# Patient Record
Sex: Male | Born: 1995 | Race: White | Hispanic: No | Marital: Married | Smoking: Never smoker
Health system: Southern US, Community
[De-identification: ages and names within clinical notes are randomized; demographics above are authoritative.]

## PROBLEM LIST (undated history)

## (undated) HISTORY — PX: HYDROCELE EXCISION / REPAIR: SUR1145

---
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
## 2013-03-19 ENCOUNTER — Emergency Department: Payer: Self-pay | Admitting: Internal Medicine

## 2013-03-19 LAB — COMPREHENSIVE METABOLIC PANEL
ANION GAP: 7 (ref 7–16)
Albumin: 4.2 g/dL (ref 3.4–5.0)
Alkaline Phosphatase: 125 U/L — ABNORMAL HIGH
BUN: 12 mg/dL (ref 7–18)
Bilirubin,Total: 0.6 mg/dL (ref 0.2–1.0)
CO2: 25 mmol/L (ref 21–32)
CREATININE: 1.07 mg/dL (ref 0.60–1.30)
Calcium, Total: 9 mg/dL (ref 8.5–10.1)
Chloride: 107 mmol/L (ref 98–107)
EGFR (African American): >60
GLUCOSE: 110 mg/dL — AB (ref 65–99)
Osmolality: 278 (ref 275–301)
POTASSIUM: 3.8 mmol/L (ref 3.5–5.1)
SGOT(AST): 11 U/L — ABNORMAL LOW (ref 15–37)
SGPT (ALT): 27 U/L (ref 12–78)
SODIUM: 139 mmol/L (ref 136–145)
Total Protein: 7.6 g/dL (ref 6.4–8.2)

## 2013-03-19 LAB — CBC WITH DIFFERENTIAL/PLATELET
BASOS ABS: 0 10*3/uL (ref 0.0–0.1)
BASOS PCT: 0.7 %
Eosinophil #: 0.2 10*3/uL (ref 0.0–0.7)
Eosinophil %: 2.8 %
HCT: 43.3 % (ref 40.0–52.0)
HGB: 15.2 g/dL (ref 13.0–18.0)
Lymphocyte #: 2.1 10*3/uL (ref 1.0–3.6)
Lymphocyte %: 36.6 %
MCH: 32 pg (ref 26.0–34.0)
MCHC: 35.1 g/dL (ref 32.0–36.0)
MCV: 91 fL (ref 80–100)
MONO ABS: 0.4 x10 3/mm (ref 0.2–1.0)
Monocyte %: 6.2 %
NEUTROS ABS: 3.1 10*3/uL (ref 1.4–6.5)
NEUTROS PCT: 53.7 %
PLATELETS: 279 10*3/uL (ref 150–440)
RBC: 4.75 10*6/uL (ref 4.40–5.90)
RDW: 12.7 % (ref 11.5–14.5)
WBC: 5.7 10*3/uL (ref 3.8–10.6)

## 2013-03-19 LAB — URINALYSIS, COMPLETE
Bacteria: NEGATIVE
Bilirubin,UR: NEGATIVE
GLUCOSE, UR: NEGATIVE mg/dL (ref 0–75)
Ketone: NEGATIVE
Leukocyte Esterase: NEGATIVE
Nitrite: NEGATIVE
PH: 5 (ref 4.5–8.0)
Protein: NEGATIVE
RBC,UR: >30 /HPF (ref 0–5)
Specific Gravity: 1.023 (ref 1.003–1.030)
WBC UR: NONE SEEN /HPF (ref 0–5)

## 2013-05-12 ENCOUNTER — Ambulatory Visit: Payer: Self-pay | Admitting: Family Medicine

## 2014-09-11 ENCOUNTER — Ambulatory Visit (INDEPENDENT_AMBULATORY_CARE_PROVIDER_SITE_OTHER): Payer: BLUE CROSS/BLUE SHIELD | Admitting: Family Medicine

## 2014-09-11 ENCOUNTER — Encounter: Payer: Self-pay | Admitting: Family Medicine

## 2014-09-11 VITALS — BP 124/86 | HR 80 | Temp 98.1°F | Resp 16 | Ht 74.5 in | Wt 209.0 lb

## 2014-09-11 DIAGNOSIS — R21 Rash and other nonspecific skin eruption: Secondary | ICD-10-CM

## 2014-09-11 DIAGNOSIS — L309 Dermatitis, unspecified: Secondary | ICD-10-CM | POA: Diagnosis not present

## 2014-09-11 DIAGNOSIS — Z87442 Personal history of urinary calculi: Secondary | ICD-10-CM

## 2014-09-11 DIAGNOSIS — M542 Cervicalgia: Secondary | ICD-10-CM

## 2014-09-11 DIAGNOSIS — R221 Localized swelling, mass and lump, neck: Secondary | ICD-10-CM | POA: Diagnosis not present

## 2014-09-11 MED ORDER — TRIAMCINOLONE ACETONIDE 0.1 % EX CREA: 1.0000 "application " | TOPICAL_CREAM | Freq: Two times a day (BID) | CUTANEOUS | Status: DC

## 2014-09-11 NOTE — Progress Notes (Signed)
Subjective:    Patient ID: Mike Torres, male    DOB: 03-08-1995, 19 y.o.   MRN: [id number]  Neck Pain  This is a new problem. The current episode started in the past 7 days. The problem occurs constantly. The problem has been gradually improving. The pain is associated with an unknown factor. The pain is present in the left side. Quality: Stiffness/Tightness. The pain is at a severity of 4/10. The symptoms are aggravated by twisting. The pain is same all the time. Stiffness is present all day. Pertinent negatives include no fever, headaches, numbness, trouble swallowing or weakness. He has tried NSAIDs for the symptoms. The treatment provided mild relief.  Rash This is a recurrent problem. The problem is unchanged. The affected locations include the right arm and left arm (Also behind both knees). The rash is characterized by blistering and itchiness. Associated with: Generally only happens when he is exposed to the sun. Associated symptoms include congestion and rhinorrhea. Pertinent negatives include no fatigue, fever, joint pain or sore throat. Past treatments include antibiotic cream, anti-itch cream and topical steroids. The treatment provided no relief. His past medical history is significant for allergies.   Patient Active Problem List   Diagnosis Date Noted  . History of kidney stones 09/11/2014   Family History  Problem Relation Age of Onset  . Healthy Mother   . Hypertension Father   . Healthy Brother   . Diabetes Maternal Grandmother   . Melanoma Maternal Grandmother   . Breast cancer Maternal Grandmother   . Colon cancer Maternal Grandfather   . Healthy Brother    History   Social History  . Marital Status: Single    Spouse Name: N/A  . Number of Children: N/A  . Years of Education: H/S   Occupational History  .  [employer]    Full - Time   Social History Main Topics  . Smoking status: Never Smoker   . Smokeless tobacco: Never Used  . Alcohol Use: No  . Drug Use: No   . Sexual Activity: Not on file   Other Topics Concern  . Not on file   Social History Narrative   Past Surgical History  Procedure Laterality Date  . Hydrocele excision / repair    . Tympanostomy tube placement  1996   No Known Allergies Previous Medications   No medications on file       Review of Systems  Constitutional: Negative for fever, chills, diaphoresis, activity change, appetite change, fatigue and unexpected weight change.  HENT: Positive for congestion, postnasal drip and rhinorrhea. Negative for ear discharge, ear pain, facial swelling, hearing loss, mouth sores, nosebleeds, sinus pressure, sneezing, sore throat, tinnitus, trouble swallowing and voice change.        History of Allergies.  Musculoskeletal: Positive for neck pain and neck stiffness. Negative for myalgias, back pain, joint pain, joint swelling, arthralgias and gait problem.  Skin: Positive for rash. Negative for color change, pallor and wound.  Allergic/Immunologic: Positive for environmental allergies.  Neurological: Negative for dizziness, syncope, weakness, light-headedness, numbness and headaches.       Objective:   Physical Exam  Constitutional: He is oriented to person, place, and time. He appears well-developed and well-nourished.  HENT:  Head: Normocephalic and atraumatic.  Right Ear: Tympanic membrane and external ear normal.  Left Ear: Tympanic membrane and external ear normal.  Nose: Mucosal edema present. No rhinorrhea. Right sinus exhibits no maxillary sinus tenderness. Left sinus exhibits no maxillary sinus tenderness.  Mouth/Throat: Uvula is midline and oropharynx is clear and moist.  Eyes: Conjunctivae and EOM are normal. Pupils are equal, round, and reactive to light.  Neck: Trachea normal and normal range of motion. Neck supple. Normal carotid pulses and no hepatojugular reflux present. Muscular tenderness: with tenderness and fullness over left sternocleidomastoid.  Carotid  bruit is not present. Normal range of motion present. No Brudzinski's sign and no Kernig's sign noted. Thyromegaly (Maybe mild) present.  Cardiovascular: Normal rate and regular rhythm.   Pulmonary/Chest: Effort normal and breath sounds normal. He has no wheezes. He has no rales.  Neurological: He is alert and oriented to person, place, and time.  Skin: Skin is warm and dry. Rash (does have scaly, dry rash in antecubital fossa. ) noted.    BP 124/86 mmHg  Pulse 80  Temp(Src) 98.1 F (36.7 C) (Oral)  Resp 16  Ht 6' 2.5" (1.892 m)  Wt 209 lb (94.802 kg)  BMI 26.48 kg/m2       Assessment & Plan:  1. Neck pain Suspect muscle strain.  Etiology unclear. Does lift a work. Will monitor. Is improving.   2. Eczema Condition is worsening. Will start medication for better control.  Call back if condition worsens or does not continue to improve.    - triamcinolone cream (KENALOG) 0.1 %; Apply 1 application topically 2 (two) times daily.  Dispense: 45 g; Refill: 3  3. Neck swelling Does have mildly enlarged thyroid and fullness in left neck.  Will schedule ultrasound and further plan pending these results. - US Soft Tissue Head/Neck; Future  Lorie Phenix, MD

## 2014-09-14 ENCOUNTER — Ambulatory Visit
Admission: RE | Admit: 2014-09-14 | Discharge: 2014-09-14 | Disposition: A | Discharge status: Home / Self Care | Payer: BLUE CROSS/BLUE SHIELD | Source: Ambulatory Visit | Attending: Family Medicine | Admitting: Family Medicine

## 2014-09-14 DIAGNOSIS — R221 Localized swelling, mass and lump, neck: Secondary | ICD-10-CM | POA: Insufficient documentation

## 2014-09-14 DIAGNOSIS — E049 Nontoxic goiter, unspecified: Secondary | ICD-10-CM | POA: Diagnosis present

## 2014-09-17 ENCOUNTER — Telehealth: Payer: Self-pay

## 2014-09-17 NOTE — Telephone Encounter (Signed)
Pt advised; he reports the swelling is much better.   Thanks,   -Vernona Rieger

## 2014-09-17 NOTE — Telephone Encounter (Signed)
-----   Message from Lorie Phenix, MD sent at 09/16/2014  2:21 PM EDT ----- Thyroid normal. Please notify patient and see if swelling has continued to go down. Thanks.

## 2015-03-08 ENCOUNTER — Encounter: Payer: Self-pay | Admitting: Family Medicine

## 2015-03-08 ENCOUNTER — Ambulatory Visit (INDEPENDENT_AMBULATORY_CARE_PROVIDER_SITE_OTHER): Payer: BLUE CROSS/BLUE SHIELD | Admitting: Family Medicine

## 2015-03-08 VITALS — BP 120/76 | HR 65 | Temp 97.8°F | Resp 14 | Wt 206.8 lb

## 2015-03-08 DIAGNOSIS — F410 Panic disorder [episodic paroxysmal anxiety] without agoraphobia: Secondary | ICD-10-CM | POA: Diagnosis not present

## 2015-03-08 NOTE — Patient Instructions (Signed)

## 2015-03-08 NOTE — Progress Notes (Signed)
Patient ID: Mike Torres, male   DOB: 22-Jan-1996, 22 y.o.   MRN: [id number]   [name] Male    DOB: Apr 18, 1995   20 y.o.   MRN: [id number] Visit Date: 03/08/2015  Today's Provider: Dortha Kern, PA   Chief Complaint  Patient presents with  . Anxiety   Subjective:    HPI  This 20 year old male had an episode of heart racing, nausea, headache and crying spell yesterday after being surprised with a new request to take a driving test after work. 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 Normally sleeps from 8pm-1am and gets a nap in the afternoons. Eats 3 meals a day and has started a regular exercise program. No recent cough, congestion, chest pain, dyspnea, fever, vomiting or diarrhea.  Patient Active Problem List   Diagnosis Date Noted  . History of kidney stones 09/11/2014  . Eczema 09/11/2014  . Neck swelling 09/11/2014   Past Surgical History  Procedure Laterality Date  . Hydrocele excision / repair    . Tympanostomy tube placement  1996   Family History  Problem Relation Age of Onset  . Healthy Mother   . Hypertension Father   . Healthy Brother   . Diabetes Maternal Grandmother   . Melanoma Maternal Grandmother   . Breast cancer Maternal Grandmother   . Colon cancer Maternal Grandfather   . Healthy Brother      Previous Medications   TRIAMCINOLONE CREAM (KENALOG) 0.1 %    Apply 1 application topically 2 (two) times daily.   No Known Allergies  Review of Systems  Constitutional: Negative.   HENT: Negative.   Eyes: Negative.   Respiratory: Negative.   Cardiovascular: Negative.   Gastrointestinal: Negative.   Endocrine: Negative.   Genitourinary: Negative.   Musculoskeletal: Negative.   Skin: Negative.   Allergic/Immunologic: Negative.   Neurological: Negative.   Hematological: Negative.   Psychiatric/Behavioral: The patient  is nervous/anxious.     Social History  Substance Use Topics  . Smoking status: Never Smoker   . Smokeless tobacco: Never Used  . Alcohol Use: No   Objective:   BP 120/76 mmHg  Pulse 65  Temp(Src) 97.8 F (36.6 C) (Oral)  Resp 14  Wt 206 lb 12.8 oz (93.804 kg)  SpO2 97%  Physical Exam  Constitutional: He is oriented to person, place, and time. He appears well-developed and well-nourished.  HENT:  Head: Normocephalic and atraumatic.  Right Ear: External ear normal.  Left Ear: External ear normal.  Nose: Nose normal.  Mouth/Throat: Oropharynx is clear and moist.  Eyes: Conjunctivae and EOM are normal. Pupils are equal, round, and reactive to light.  Neck: Normal range of motion. Neck supple. No thyromegaly present.  Cardiovascular: Normal rate, regular rhythm, normal heart sounds and intact distal pulses.   Pulmonary/Chest: Effort normal and breath sounds normal. No respiratory distress.  Abdominal: Soft. Bowel sounds are normal.  Musculoskeletal: Normal range of motion.  Neurological: He is alert and oriented to person, place, and time. He has normal reflexes.  Skin: No rash noted.  Psychiatric: He has a normal mood and affect. His behavior is normal. Judgment and thought content normal.      Assessment & Plan:     1. Panic attack Onset yesterday morning after working his usual shift and getting an unusual and  exceptional stress. Recommend he continue well balanced meals and monitor for signs of the onset of a flu/viral syndrome that could have caused similar symptoms. Feeling calm with resolution of fast heart beat, nausea, crying spell and headache today. Counseled regarding stress and anxiety control. Recheck if recurrences occur frequently. No medication needed today. Given note for out of work yesterday and may return to normal work schedule 03-11-15,

## 2015-05-14 ENCOUNTER — Ambulatory Visit
Admission: RE | Admit: 2015-05-14 | Discharge: 2015-05-14 | Disposition: A | Discharge status: Home / Self Care | Payer: BLUE CROSS/BLUE SHIELD | Source: Ambulatory Visit | Attending: Family Medicine | Admitting: Family Medicine

## 2015-05-14 ENCOUNTER — Ambulatory Visit (INDEPENDENT_AMBULATORY_CARE_PROVIDER_SITE_OTHER): Payer: BLUE CROSS/BLUE SHIELD | Admitting: Family Medicine

## 2015-05-14 ENCOUNTER — Encounter: Payer: Self-pay | Admitting: Family Medicine

## 2015-05-14 VITALS — BP 120/80 | HR 80 | Temp 98.1°F | Resp 16 | Wt 187.0 lb

## 2015-05-14 DIAGNOSIS — S161XXA Strain of muscle, fascia and tendon at neck level, initial encounter: Secondary | ICD-10-CM

## 2015-05-14 DIAGNOSIS — X58XXXA Exposure to other specified factors, initial encounter: Secondary | ICD-10-CM | POA: Insufficient documentation

## 2015-05-14 DIAGNOSIS — M542 Cervicalgia: Secondary | ICD-10-CM | POA: Diagnosis present

## 2015-05-14 MED ORDER — NAPROXEN 500 MG PO TABS: 500.0000 mg | ORAL_TABLET | Freq: Two times a day (BID) | ORAL | Status: DC

## 2015-05-14 MED ORDER — CYCLOBENZAPRINE HCL 5 MG PO TABS: 5.0000 mg | ORAL_TABLET | Freq: Every day | ORAL | Status: DC

## 2015-05-14 NOTE — Progress Notes (Signed)
[name] Male    DOB: Apr 12, 1995   20 y.o.   MRN: [id number] Visit Date: 05/14/2015  Today's Provider: Lorie Phenix, MD   Chief Complaint  Patient presents with  . Neck Pain   Subjective:    Neck Pain  This is a new problem. The current episode started in the past 7 days (Last wednesday he woke up with stiffness on the left side of his neck.). The problem occurs every several days. The problem has been gradually improving. The pain is associated with an unknown (He does work for [employer] and lifts heavy boxes and at the gym he is lifting weights.) factor. The pain is present in the left side. Quality: Just feels numbness on his head since Friday and today pressure all over his head. The patient is experiencing no pain. The symptoms are aggravated by bending. Stiffness is present at night. Associated symptoms include numbness (on his head). Pertinent negatives include no chest pain, fever, headaches, leg pain, pain with swallowing, photophobia, syncope, tingling, trouble swallowing, visual change or weakness. He has tried nothing for the symptoms.  Patient is concerned about having a brain tumor.     No Known Allergies Previous Medications   TRIAMCINOLONE CREAM (KENALOG) 0.1 %    Apply 1 application topically 2 (two) times daily.    Review of Systems  Constitutional: Negative.  Negative for fever.  HENT: Negative.  Negative for trouble swallowing.   Eyes: Negative.  Negative for photophobia.  Cardiovascular: Negative for chest pain and syncope.  Musculoskeletal: Positive for neck pain.  Neurological: Positive for numbness (on his head). Negative for tingling, weakness and headaches.    Social History  Substance Use Topics  . Smoking status: Never Smoker   . Smokeless tobacco: Never Used  . Alcohol Use: No   Objective:   BP 120/80 mmHg  Pulse 80  Temp(Src) 98.1 F (36.7 C) (Oral)  Resp 16  Wt 187 lb (84.823 kg)  Physical Exam  Constitutional: He is  oriented to person, place, and time. He appears well-developed and well-nourished.  HENT:  Right Ear: Tympanic membrane normal.  Left Ear: Tympanic membrane normal.  Eyes: Conjunctivae and EOM are normal. Pupils are equal, round, and reactive to light.  Neck: Normal range of motion. Neck supple.  Some tenderness in left anterior neck muscles and posterior neck bilaterally.    Cardiovascular: Normal rate, regular rhythm and normal heart sounds.   Pulmonary/Chest: Effort normal and breath sounds normal. No respiratory distress. He has no wheezes.  Musculoskeletal: Normal range of motion.  Neurological: He is alert and oriented to person, place, and time. No cranial nerve deficit. He exhibits normal muscle tone. Coordination normal.  Psychiatric: He has a normal mood and affect. His behavior is normal.      Assessment & Plan:     1. Neck muscle strain, initial encounter:  New probem. Patient was re-assured that with the symptoms that it is very unlikely to be a brain tumor. Is more of a muscle strain. He is extremely anxious about it.   Looked it up on the internet. Placed order for Xray cervical of neck. Will treat with Naproxen 500 mg BID and Flexeril 5 mg; muscle relaxer for the night and antiinflammatory during the day. Note for his work was written until Monday and will recheck on Friday.  He should also not lift any heavy object. Patient is to follow-up on Friday. - DG Cervical Spine Complete; Future  Patient was seen and examined by Leo GrosserNancy J. Senan Urey, MD, and scribed by Hetty ElyJoseline Rosas, CMA.   I have reviewed the document for accuracy and completeness and I agree with above. - Leo GrosserNancy J. Carlon Davidson, MD   Lorie PhenixNancy Moon Budde, MD  [facility]

## 2015-05-15 ENCOUNTER — Telehealth: Payer: Self-pay

## 2015-05-15 NOTE — Telephone Encounter (Signed)
-----   Message from Lorie PhenixNancy Maloney, MD sent at 05/14/2015  4:30 PM EDT ----- Normal cervical spine xray except head was slightly tilted. Proceed with plan as discussed and follow up Friday. Thanks.

## 2015-05-15 NOTE — Telephone Encounter (Signed)
Patient advised as directed below. Patient verbalized understanding.  

## 2015-05-17 ENCOUNTER — Ambulatory Visit (INDEPENDENT_AMBULATORY_CARE_PROVIDER_SITE_OTHER): Payer: BLUE CROSS/BLUE SHIELD | Admitting: Family Medicine

## 2015-05-17 ENCOUNTER — Encounter: Payer: Self-pay | Admitting: Family Medicine

## 2015-05-17 VITALS — BP 116/80 | HR 56 | Temp 97.6°F | Resp 16 | Wt 191.0 lb

## 2015-05-17 DIAGNOSIS — S161XXD Strain of muscle, fascia and tendon at neck level, subsequent encounter: Secondary | ICD-10-CM | POA: Diagnosis not present

## 2015-05-17 DIAGNOSIS — S161XXA Strain of muscle, fascia and tendon at neck level, initial encounter: Secondary | ICD-10-CM | POA: Insufficient documentation

## 2015-05-17 NOTE — Progress Notes (Signed)
Subjective:    Patient ID: Mike Torres, male    DOB: 1995/03/16, 20 y.o.   MRN: [id number]  Neck Pain  Chronicity: FU from 05/14/2015; provider ordered imaging which was WNL. The problem has been gradually improving (about 50% improved per pt). The pain is present in the midline. The quality of the pain is described as shooting. The pain is at a severity of 5/10. The pain is moderate. The symptoms are aggravated by bending (still feels the numbness on top of head with bending). Associated symptoms include numbness and tingling (left hand which has improved from LOV). Pertinent negatives include no chest pain, fever, headaches, leg pain, syncope, visual change, weakness or weight loss. Treatments tried: Flexeril and Naproxen prescribed at LOV. The treatment provided moderate relief.      Review of Systems  Constitutional: Negative for fever and weight loss.  Cardiovascular: Negative for chest pain and syncope.  Musculoskeletal: Positive for neck pain.  Neurological: Positive for tingling (left hand which has improved from LOV) and numbness. Negative for weakness and headaches.   BP 116/80 mmHg  Pulse 56  Temp(Src) 97.6 F (36.4 C) (Oral)  Resp 16  Wt 191 lb (86.637 kg)   Patient Active Problem List   Diagnosis Date Noted  . History of kidney stones 09/11/2014  . Eczema 09/11/2014  . Neck swelling 09/11/2014   No past medical history on file. Current Outpatient Prescriptions on File Prior to Visit  Medication Sig  . cyclobenzaprine (FLEXERIL) 5 MG tablet Take 1 tablet (5 mg total) by mouth at bedtime.  . naproxen (NAPROSYN) 500 MG tablet Take 1 tablet (500 mg total) by mouth 2 (two) times daily with a meal.  . triamcinolone cream (KENALOG) 0.1 % Apply 1 application topically 2 (two) times daily. (Patient not taking: Reported on 05/17/2015)   No current facility-administered medications on file prior to visit.   No Known Allergies Past Surgical History  Procedure Laterality  Date  . Hydrocele excision / repair    . Tympanostomy tube placement  1996   Social History   Social History  . Marital Status: Single    Spouse Name: N/A  . Number of Children: N/A  . Years of Education: H/S   Occupational History  .  [employer]    Full - Time   Social History Main Topics  . Smoking status: Never Smoker   . Smokeless tobacco: Never Used  . Alcohol Use: No  . Drug Use: No  . Sexual Activity: Not on file   Other Topics Concern  . Not on file   Social History Narrative   Family History  Problem Relation Age of Onset  . Healthy Mother   . Hypertension Father   . Healthy Brother   . Diabetes Maternal Grandmother   . Melanoma Maternal Grandmother   . Breast cancer Maternal Grandmother   . Colon cancer Maternal Grandfather   . Healthy Brother         Objective:   Physical Exam  Constitutional: He appears well-developed and well-nourished.  Musculoskeletal:  Tender over left trapezius with radicular sx  Neurological: He has normal strength.  Reflexes are symmetric  Psychiatric: He has a normal mood and affect. His behavior is normal.  BP 116/80 mmHg  Pulse 56  Temp(Src) 97.6 F (36.4 C) (Oral)  Resp 16  Wt 191 lb (86.637 kg)     Assessment & Plan:  1. Neck muscle strain, subsequent encounter Continue current medication. Remain out of work.  Recheck in 2 weeks.   - Ambulatory referral to Physical Therapy    Patient seen and examined by Leo GrosserNancy J. Gautam Langhorst, MD, and note scribed by Allene DillonEmily Drozdowski, CMA.  I have reviewed the document for accuracy and completeness and I agree with above. Leo Grosser- Amauri Medellin J. Josyah Achor, MD   Lorie PhenixNancy Gizzelle Lacomb, MD

## 2015-05-23 ENCOUNTER — Telehealth: Payer: Self-pay

## 2015-05-23 NOTE — Telephone Encounter (Signed)
Patient is calling requesting update on his disability forms? He states that he most have form filled and signed by tomorrow before his insurance cancels out. KW

## 2015-05-24 NOTE — Telephone Encounter (Signed)
Was this taken care of? Thanks! 

## 2015-05-27 ENCOUNTER — Ambulatory Visit: Payer: BLUE CROSS/BLUE SHIELD | Attending: Family Medicine | Admitting: Physical Therapy

## 2015-05-27 DIAGNOSIS — M79602 Pain in left arm: Secondary | ICD-10-CM | POA: Diagnosis present

## 2015-05-27 DIAGNOSIS — R293 Abnormal posture: Secondary | ICD-10-CM | POA: Diagnosis present

## 2015-05-27 DIAGNOSIS — M6281 Muscle weakness (generalized): Secondary | ICD-10-CM

## 2015-05-28 ENCOUNTER — Encounter: Payer: Self-pay | Admitting: Physical Therapy

## 2015-05-28 NOTE — Therapy (Signed)
[facility] [address] [place], [place], [address] Phone: [phone]   Fax:  [phone]  Physical Therapy Treatment  Patient Details  Name: Mike Torres MRN: [id number] Date of Birth: 15-Jul-1995 Referring Provider: Lorie PhenixMaloney, Nancy  Encounter Date: 05/27/2015      PT End of Session - 05/28/15 0723    Visit Number 1   Number of Visits 8   Date for PT Re-Evaluation 06/25/15   PT Start Time 1425   PT Stop Time 1512   PT Time Calculation (min) 47 min   Activity Tolerance Patient tolerated treatment well;No increased pain   Behavior During Therapy [facility] for tasks assessed/performed      History reviewed. No pertinent past medical history.  Past Surgical History  Procedure Laterality Date  . Hydrocele excision / repair    . Tympanostomy tube placement  1996    There were no vitals filed for this visit.      Subjective Assessment - 05/28/15 0711    Subjective Pt reports that ~1 week ago he was at work for [employer] and his "whole head became numb."  Pt states that another time recently he was playing golf when he felt pain shoot up into his head.  Pt also states that he occasionally will get sharp pains in his forehead.  Pt has worked for [employer] for the past 3 years.  When pt has an episode of pain he reports that walking around helps to decrease his pain.  Pt reports that episodes last 5-10 minutes and rates the pain at a 5/10.  Pt used to get pain up and down his L arm when having an episode but since he took disability time off work and stopped going to the gym for the past 2 weeks, he no longer has pain in his arm with episodes.  Pt does not notice any new weakness in his L UE.  Pt was prescribed MM relaxers for the evenings, but has not been taking them as he is required to call into his work daily at 2am to report that he will not be in that day.  Pt's follow up physician visit is 05/31/2015.  Pt enjoys playing golf and going to the gym  in his free time.    Limitations Other (comment)  working for [employer], playing golf, going to the gym.   Patient Stated Goals To return back to work, going to the gym, and playing golf without pain.    Currently in Pain? No/denies            [facility] Assessment - 05/28/15 0001    Assessment   Medical Diagnosis cervical strain   Referring Provider Lorie PhenixMaloney, Nancy   Onset Date/Surgical Date 02/10/15   Next MD Visit 05/31/2015   Prior Therapy No     OBJECTIVE: NeuroReEd: Pt educated on and performed exercises within HEP including 15x scapular retractions, 10x supine cervical retractions, 2x30 sec B UT stretch, 2x30 sec B levator stretch, 3x30 sec pec doorway stretch.     Pt requires skilled PT services to increase L grip stretch, improve static/dynamic posture, and to enable pt to RTW.  Pt tolerated treatment well today as evidenced by remaining an active participate during evaluation and agreeing to complete HEP.        PT Education - 05/28/15 0719    Education provided Yes   Education Details Educated on HEP and proper posture.    Person(s) Educated Patient   Methods  Explanation;Demonstration;Tactile cues;Verbal cues;Handout   Comprehension Verbalized understanding;Returned demonstration;Verbal cues required             PT Long Term Goals - 05/28/15 0739    PT LONG TERM GOAL #1   Title Pt will report less than 10% on NDI self-percieved questionnaire to improve pt's percieved level of disability   Baseline 24% on 05/27/2015   Time 4   Period Weeks   Status New   PT LONG TERM GOAL #2   Title Pt will demonstrate 80lbs L grip strength with dynamometer to improve equality of pt's B grip strength.   Baseline R: 80.1 and L: 64.2 on 05/27/2015   Time 4   Period Weeks   Status New   PT LONG TERM GOAL #3   Title Pt will demonstrate good static and dynamic posture for 2 treatment sessions requiring less than 2 v.c. to improve pt's static/dynamic posture during function.    Baseline Pt has moderate FWD head and rounded shoulder posture on 05/27/2015   Time 4   Period Weeks   Status New   PT LONG TERM GOAL #4   Title Pt will report returning to work at [employer] full time with no episodes of increase pain to improve pt's ability to work.   Baseline Pt is unable to work his regular job d/t sx on 05/27/2015   Time 4   Period Weeks   Status New            Plan - 05/28/15 0726    Clinical Impression Statement Pt is a 20 y.o. male who presents to the clinic with cervical/L UE pain.  Pt demonstrates a negative cervical quadrant test, and positive ULTT test for median N on the L.  Pt has a moderate FWD head posture with rounded shoulders.  Pt presents with decrease in grip strength (R: 80.1 lbs; L: 64.2 lbs).  With most cervical and UE ROM movements pt reports that he was experiencing "numbness".   SPT was unable to reproduce painful sx with any motions.  Pt has tenderness in B suboccipital regions with minimal trigger points present in his posterior L neck and upper trap region.  Pt c/o tenderness with PA mobilizations at C5/6 and T4/5, spinal mobility was [facility].  Pt. will benefit from skilled PT services to increase upright posture/ B UE strength to improve painfree mobility/ return to work and golfing.    Rehab Potential Good   PT Frequency 2x / week   PT Duration 4 weeks   PT Treatment/Interventions Therapeutic activities;Therapeutic exercise;Neuromuscular re-education;Patient/family education;Manual techniques;Passive range of motion   PT Next Visit Plan Assess HEP.  Introduce postural and strengthening exercises.    PT Home Exercise Plan Pt given scap retractions, cervical retractions, doorway stretch, trap stretch, and levator stretch.    Consulted and Agree with Plan of Care Patient      Patient will benefit from skilled therapeutic intervention in order to improve the following deficits and impairments:  Decreased strength, Improper body mechanics, Postural  dysfunction, Pain, Decreased safety awareness  Visit Diagnosis: Abnormal posture  Muscle weakness (generalized)  Pain In Left Arm     Problem List Patient Active Problem List   Diagnosis Date Noted  . Neck muscle strain 05/17/2015  . History of kidney stones 09/11/2014  . Eczema 09/11/2014  . Neck swelling 09/11/2014   Cammie Mcgee, PT, DPT # [phone]   05/28/2015, 8:16 AM  [facility] [address] [place], [place], [address] Phone: [phone]   Fax:  [phone]  Name: Mike Torres MRN: [id number] Date of Birth: 19-Jul-1995

## 2015-05-30 ENCOUNTER — Ambulatory Visit: Payer: BLUE CROSS/BLUE SHIELD | Admitting: Physical Therapy

## 2015-05-30 DIAGNOSIS — M6281 Muscle weakness (generalized): Secondary | ICD-10-CM

## 2015-05-30 DIAGNOSIS — M79602 Pain in left arm: Secondary | ICD-10-CM

## 2015-05-30 DIAGNOSIS — R293 Abnormal posture: Secondary | ICD-10-CM | POA: Diagnosis not present

## 2015-05-31 ENCOUNTER — Encounter: Payer: Self-pay | Admitting: Family Medicine

## 2015-05-31 ENCOUNTER — Ambulatory Visit (INDEPENDENT_AMBULATORY_CARE_PROVIDER_SITE_OTHER): Payer: BLUE CROSS/BLUE SHIELD | Admitting: Family Medicine

## 2015-05-31 VITALS — BP 120/80 | HR 80 | Temp 97.9°F | Resp 16 | Wt 189.0 lb

## 2015-05-31 DIAGNOSIS — M5412 Radiculopathy, cervical region: Secondary | ICD-10-CM

## 2015-05-31 DIAGNOSIS — S161XXD Strain of muscle, fascia and tendon at neck level, subsequent encounter: Secondary | ICD-10-CM

## 2015-05-31 NOTE — Therapy (Addendum)
[facility] [address] [place], [place], [address] Phone: [phone]   Fax:  [phone]  Physical Therapy Treatment  Patient Details  Name: Mike Torres MRN: [id number] Date of Birth: Oct 29, 1995 Referring Provider: Lorie PhenixMaloney, Nancy  Encounter Date: 05/30/2015    No past medical history on file.  Past Surgical History  Procedure Laterality Date  . Hydrocele excision / repair    . Tympanostomy tube placement  1996    There were no vitals filed for this visit.       Pt. states he is still having persistent numbness in head and pts. mother reports that pt. is concerned he has a brain tumor.  Pt. reports no pain at this time but L sided HA/ numbness in sitting posture. Pt. concerned about his ability to complete job demands at this time.      OBJECTIVE:  Reviewed HEP.  Nautilus: 40# scap. Retraction 30x/ attempted ER with Nautilus (pain limited).  Standing B shoulder flexion/ scap. Retraction (cuing to relax UT musculature).  Manual tx.: Supine L/R cervical and levator stretches (pt. Reports a good stretch but no pain).  Cervical traction (no change in numbness/pain)- 4x each with static holds.  Supine/ seated trigger point release techniques to L/R UT region (as tolerated with 30 sec. Static holds).  STM to B UT in sitting after tx.  Ice to cervical region/ B shoulder in sitting after tx.     Pt response for medical necessity:  No increase c/o pain with occasional reports of numbness in head with trigger point/ manual tx.       Several UT/low cervical paraspinal trigger points noted during manual palpation/ treatment.  PT able to reproduce numbness in head with overpressure on trigger points/ release techniques.  Good technique with posture reeducation/ ex. program.          PT Long Term Goals - 05/28/15 0739    PT LONG TERM GOAL #1   Title Pt will report less than 10% on NDI self-percieved questionnaire to improve pt's  percieved level of disability   Baseline 24% on 05/27/2015   Time 4   Period Weeks   Status New   PT LONG TERM GOAL #2   Title Pt will demonstrate 80lbs L grip strength with dynamometer to improve equality of pt's B grip strength.   Baseline R: 80.1 and L: 64.2 on 05/27/2015   Time 4   Period Weeks   Status New   PT LONG TERM GOAL #3   Title Pt will demonstrate good static and dynamic posture for 2 treatment sessions requiring less than 2 v.c. to improve pt's static/dynamic posture during function.   Baseline Pt has moderate FWD head and rounded shoulder posture on 05/27/2015   Time 4   Period Weeks   Status New   PT LONG TERM GOAL #4   Title Pt will report returning to work at [employer] full time with no episodes of increase pain to improve pt's ability to work.   Baseline Pt is unable to work his regular job d/t sx on 05/27/2015   Time 4   Period Weeks   Status New       Patient will benefit from skilled therapeutic intervention in order to improve the following deficits and impairments:  Decreased strength, Improper body mechanics, Postural dysfunction, Pain, Decreased safety awareness  Visit Diagnosis: Abnormal posture  Muscle weakness (generalized)  Pain In Left Arm     Problem List Patient  Active Problem List   Diagnosis Date Noted  . Neck muscle strain 05/17/2015  . History of kidney stones 09/11/2014  . Eczema 09/11/2014  . Neck swelling 09/11/2014   Cammie Mcgee, PT, DPT # [id number]   06/03/2015, 9:16 AM  [facility] [address] [place], [place], [address] Phone: [phone]   Fax:  [phone]  Name: Mike Torres MRN: [id number] Date of Birth: 1995-04-05

## 2015-05-31 NOTE — Progress Notes (Addendum)
Patient ID: Mike Ramusanner B Song, male   DOB: 1996/02/05, 20 y.o.   MRN: [id number]       [name] Male    DOB: 1996/02/05   20 y.o.   MRN: [id number] Visit Date: 05/31/2015  Today's Provider: Lorie PhenixNancy Ngai Parcell, MD   Chief Complaint  Patient presents with  . Follow-up   Subjective:    HPI  Follow up for muscle strain  The patient was last seen for this 2 weeks ago. Changes made at last visit include referred to PT.  He reports excellent compliance with treatment. He feels that condition is Unchanged. He is not having side effects. Patient reports he has been going to PT twice a week. Patient reports numbness on head worse with activity or lifting weight. Patient reports he is not ready to go back to work.  ------------------------------------------------------------------------------------     No Known Allergies Previous Medications   CYCLOBENZAPRINE (FLEXERIL) 5 MG TABLET    Take 1 tablet (5 mg total) by mouth at bedtime.   NAPROXEN (NAPROSYN) 500 MG TABLET    Take 1 tablet (500 mg total) by mouth 2 (two) times daily with a meal.   TRIAMCINOLONE CREAM (KENALOG) 0.1 %    Apply 1 application topically 2 (two) times daily.    Review of Systems  Constitutional: Negative.   Cardiovascular: Negative.   Musculoskeletal: Negative.   Neurological: Positive for numbness.    Social History  Substance Use Topics  . Smoking status: Never Smoker   . Smokeless tobacco: Never Used  . Alcohol Use: No   Objective:   BP 120/80 mmHg  Pulse 80  Temp(Src) 97.9 F (36.6 C) (Oral)  Resp 16  Wt 189 lb (85.73 kg)  Physical Exam  Constitutional: He is oriented to person, place, and time. He appears well-developed and well-nourished.  Musculoskeletal: He exhibits tenderness (left trapezius. ).  Neurological: He is alert and oriented to person, place, and time. He displays abnormal reflex.  Decreased reflexes and strength in left upper extremity.   Tender at left trapezius.      Psychiatric: He has a normal mood and affect. His behavior is normal. Judgment and thought content normal.      Assessment & Plan:     1. Neck muscle strain, subsequent encounter Recurrent. Not at goal. F/U pending MRI report. Patient to follow-up in 2 weeks. Patient is to stay out of work until results or follow-up appointment.  - Cervical MRI.    2. Radiculopathy, cervical As above.      Patient seen and examined by Dr. Leo GrosserNancy J.. Daylen Hack, and note scribed by Liz BeachSulibeya S. Dimas, CMA. I have reviewed the document for accuracy and completeness and I agree with above. - Leo GrosserNancy J. Tanique Matney, MD   Lorie PhenixNancy Satchel Heidinger, MD  [facility]

## 2015-06-03 ENCOUNTER — Ambulatory Visit: Payer: BLUE CROSS/BLUE SHIELD | Admitting: Physical Therapy

## 2015-06-03 DIAGNOSIS — M79602 Pain in left arm: Secondary | ICD-10-CM

## 2015-06-03 DIAGNOSIS — M6281 Muscle weakness (generalized): Secondary | ICD-10-CM

## 2015-06-03 DIAGNOSIS — R293 Abnormal posture: Secondary | ICD-10-CM | POA: Diagnosis not present

## 2015-06-03 NOTE — Therapy (Signed)
[facility] [address] [place], [place], [address] Phone: [phone]   Fax:  [phone]  Physical Therapy Treatment  Patient Details  Name: Mike Torres MRN: [id number] Date of Birth: [birth year] Referring Provider: Lorie PhenixMaloney, Nancy  Encounter Date: 06/03/2015      PT End of Session - 06/03/15 1654    Visit Number 3   Number of Visits 8   Date for PT Re-Evaluation 06/25/15   PT Start Time 1432   PT Stop Time 1529   PT Time Calculation (min) 57 min   Activity Tolerance Patient tolerated treatment well;No increased pain   Behavior During Therapy [facility] for tasks assessed/performed      No past medical history on file.  Past Surgical History  Procedure Laterality Date  . Hydrocele excision / repair    . Tympanostomy tube placement  1996    There were no vitals filed for this visit.      Subjective Assessment - 06/03/15 1653    Subjective Pt. states MD f/u went well and is out of work for 2 more weeks.  Pt. is to continue skilled PT services and is suppose to be having a MRI (unknown date).  Pt. reports he is feeling better today and pts. girlfriend gave massage with benefit.     Limitations Other (comment)   Patient Stated Goals To return back to work, going to the gym, and playing golf without pain.    Currently in Pain? No/denies      OBJECTIVE: Manual tx.: Supine L/R cervical UT and levator stretches (no pain/numbness). Cervical traction (no change in numbness/pain)- 3x each with static holds. Seated/prone trigger point release techniques to L/R UT region (as tolerated with 30 sec. Static holds)- 9 min. STM to B UT in prone/sitting after tx.  Grade II-III PA mobs. To upper thoracic (generalized)- multiple cavitations noted.  There.ex.:  Nautilus: 40# scap. Retraction/ 40# lat. Pull downs in sitting/ 20# sh. Extension/ 20# tricep ext. 15x each.  YTB B shoulder ER/ horizontal abd. 15x each (no increase numbness reported)-  pt. Reports feeling stronger in L UE as compared to last week.  Standing 5# bicep curls 15x each.  Scifit L6 10 min. B UE/LE (slow but consistent cadence).   Ice to cervical region/ B shoulder in sitting after tx.    Pt response for medical necessity: No increase c/o pain with occasional reports of numbness in head with trigger point/ manual tx.  Pt. Progressing with low wt./ reps. In a pain-free range.  PT planning on progressing to work-related tasks (box lifting/ carrying/ sled push and pull next tx.).  No change to HEP today.       PT Long Term Goals - 05/28/15 0739    PT LONG TERM GOAL #1   Title Pt will report less than 10% on NDI self-percieved questionnaire to improve pt's percieved level of disability   Baseline 24% on 05/27/2015   Time 4   Period Weeks   Status New   PT LONG TERM GOAL #2   Title Pt will demonstrate 80lbs L grip strength with dynamometer to improve equality of pt's B grip strength.   Baseline R: 80.1 and L: 64.2 on 05/27/2015   Time 4   Period Weeks   Status New   PT LONG TERM GOAL #3   Title Pt will demonstrate good static and dynamic posture for 2 treatment sessions requiring less than 2 v.c. to improve pt's static/dynamic posture during  function.   Baseline Pt has moderate FWD head and rounded shoulder posture on 05/27/2015   Time 4   Period Weeks   Status New   PT LONG TERM GOAL #4   Title Pt will report returning to work at [employer] full time with no episodes of increase pain to improve pt's ability to work.   Baseline Pt is unable to work his regular job d/t sx on 05/27/2015   Time 4   Period Weeks   Status New           Plan - 06/03/15 1703    Clinical Impression Statement Pt. unable to tolerate >10# of UE posture strengthening due to increasing R head numbness.  Pts. numbness resolves after short rest breaks/ position changes.  Marked increase in cervical rotn. to WNL L/R in supine position.  Decrease B UT trigger points noted. as  compared to last weeks treatment.     Rehab Potential Good   PT Frequency 2x / week   PT Duration 4 weeks   PT Treatment/Interventions Therapeutic activities;Therapeutic exercise;Neuromuscular re-education;Patient/family education;Manual techniques;Passive range of motion;Dry needling   PT Next Visit Plan Dry needling to UT trigger points.  Incorporate work-related tasks.       PT Home Exercise Plan Pt given scap retractions, cervical retractions, doorway stretch, trap stretch, and levator stretch.    Consulted and Agree with Plan of Care Patient      Patient will benefit from skilled therapeutic intervention in order to improve the following deficits and impairments:  Decreased strength, Improper body mechanics, Postural dysfunction, Pain, Decreased safety awareness  Visit Diagnosis: Abnormal posture  Muscle weakness (generalized)  Pain In Left Arm     Problem List Patient Active Problem List   Diagnosis Date Noted  . Neck muscle strain 05/17/2015  . History of kidney stones 09/11/2014  . Eczema 09/11/2014  . Neck swelling 09/11/2014   Cammie Mcgee, PT, DPT # [id number]   06/03/2015, 5:15 PM  [facility] [address] [place], [place], [address] Phone: [phone]   Fax:  [phone]  Name: Mike Torres MRN: [id number] Date of Birth: 04/14/1995

## 2015-06-04 NOTE — Addendum Note (Signed)
Addended by: Leo GrosserMALONEY, Demarquis Osley J on: 06/04/2015 11:08 AM   Modules accepted: Orders

## 2015-06-06 ENCOUNTER — Ambulatory Visit: Payer: BLUE CROSS/BLUE SHIELD | Admitting: Physical Therapy

## 2015-06-06 DIAGNOSIS — M79602 Pain in left arm: Secondary | ICD-10-CM

## 2015-06-06 DIAGNOSIS — M6281 Muscle weakness (generalized): Secondary | ICD-10-CM

## 2015-06-06 DIAGNOSIS — R293 Abnormal posture: Secondary | ICD-10-CM

## 2015-06-07 NOTE — Therapy (Addendum)
[facility] [address] [place], [place], [address] Phone: [phone]   Fax:  [phone]  Physical Therapy Treatment  Patient Details  Name: CAROL LOFTIN MRN: [id number] Date of Birth: [birth year] Referring Provider: Lorie Phenix  Encounter Date: 06/06/2015    No past medical history on file.  Past Surgical History  Procedure Laterality Date  . Hydrocele excision / repair    . Tympanostomy tube placement  1996    There were no vitals filed for this visit.    Pt. reports doing much better with no c/o head numbness or limitations at this time.   Pt. states he had a f/u with MD and may be getting MRI on May 11th or 12th but is hoping he gets 100% better before then.       OBJECTIVE: Manual tx.: Supine L/R cervical UT and levator stretches (no pain/numbness). Cervical traction (no change in numbness/pain)- 3x each with static holds. Seated/prone trigger point release techniques to L/R UT region (as tolerated with 30 sec. Static holds)- 7 min. STM to B UT in prone/sitting after tx. Grade II-III PA mobs. To upper thoracic (generalized)- multiple cavitations noted. There.ex.: Nautilus: 40# scap. Retraction/ 40# lat. Pull downs in sitting/ 30# sh. Extension/ 30# tricep ext. 15x each. YTB B shoulder ER/ horizontal abd. 15x each (no increase numbness reported). Standing 5# bicep curls/ press-ups 20x each. Scifit L6 10 min. B UE/LE (slow but consistent cadence).  25# floor to waist box lifting/ B carrying/ 60# sled push and pull 70 feet in hallway. Ice to cervical region/ B shoulder in sitting after tx.    Pt response for medical necessity: No increase c/o pain with occasional reports of numbness in head with trigger point/ manual tx. Pt. Progressing with low wt./ reps. In a pain-free range. No numbenss for 1st time t/o tx. session.    No c/o numbness or pain t/o tx. session.  Pt. still limited with heavier wts./  strengthening but good technique and body mechanics with lifting/ pushing/ pulling/carrying tasks in clinic.  PT will focus on strengthening ex. program and work-related tasks to promote RTW in 1-2 weeks.         PT Long Term Goals - 05/28/15 0739    PT LONG TERM GOAL #1   Title Pt will report less than 10% on NDI self-percieved questionnaire to improve pt's percieved level of disability   Baseline 24% on 05/27/2015   Time 4   Period Weeks   Status New   PT LONG TERM GOAL #2   Title Pt will demonstrate 80lbs L grip strength with dynamometer to improve equality of pt's B grip strength.   Baseline R: 80.1 and L: 64.2 on 05/27/2015   Time 4   Period Weeks   Status New   PT LONG TERM GOAL #3   Title Pt will demonstrate good static and dynamic posture for 2 treatment sessions requiring less than 2 v.c. to improve pt's static/dynamic posture during function.   Baseline Pt has moderate FWD head and rounded shoulder posture on 05/27/2015   Time 4   Period Weeks   Status New   PT LONG TERM GOAL #4   Title Pt will report returning to work at [employer] full time with no episodes of increase pain to improve pt's ability to work.   Baseline Pt is unable to work his regular job d/t sx on 05/27/2015   Time 4   Period Weeks   Status  New             Patient will benefit from skilled therapeutic intervention in order to improve the following deficits and impairments:  Decreased strength, Improper body mechanics, Postural dysfunction, Pain, Decreased safety awareness  Visit Diagnosis: Abnormal posture  Muscle weakness (generalized)  Pain In Left Arm     Problem List Patient Active Problem List   Diagnosis Date Noted  . Neck muscle strain 05/17/2015  . History of kidney stones 09/11/2014  . Eczema 09/11/2014  . Neck swelling 09/11/2014   Cammie McgeeMichael C Sherk, PT, DPT # [id number]   06/09/2015, 7:11 PM  [place] [facility] [address] [place], [place], [address] Phone: [phone]   Fax:  [phone]  Name: Darien Ramusanner B Jaquay MRN: [id number] Date of Birth: January 22, 1996

## 2015-06-10 ENCOUNTER — Encounter: Payer: Self-pay | Admitting: Physical Therapy

## 2015-06-10 ENCOUNTER — Ambulatory Visit: Payer: BLUE CROSS/BLUE SHIELD | Attending: Family Medicine | Admitting: Physical Therapy

## 2015-06-10 DIAGNOSIS — M79602 Pain in left arm: Secondary | ICD-10-CM | POA: Insufficient documentation

## 2015-06-10 DIAGNOSIS — M6281 Muscle weakness (generalized): Secondary | ICD-10-CM

## 2015-06-10 DIAGNOSIS — R293 Abnormal posture: Secondary | ICD-10-CM | POA: Diagnosis present

## 2015-06-10 NOTE — Therapy (Signed)
[facility] [address] [place], [place], [address] Phone: [phone]   Fax:  [phone]  Physical Therapy Treatment  Patient Details  Name: Mike Torres MRN: [id number] Date of Birth: Jun 22, 1995 Referring Provider: Margarita Rana  Encounter Date: 06/10/2015      PT End of Session - 06/10/15 1437    Visit Number 5   Number of Visits 8   Date for PT Re-Evaluation 06/25/15   PT Start Time 4196   PT Stop Time 1521   PT Time Calculation (min) 48 min   Activity Tolerance Patient tolerated treatment well;No increased pain   Behavior During Therapy [facility] for tasks assessed/performed      History reviewed. No pertinent past medical history.  Past Surgical History  Procedure Laterality Date  . Hydrocele excision / repair    . Tympanostomy tube placement  1996    There were no vitals filed for this visit.      Subjective Assessment - 06/10/15 1434    Subjective Pt. reports he had a great weekend and no c/o numbness in head this weekend.  Pt. hoping to return to playing golf this week.     Limitations Other (comment)   Patient Stated Goals To return back to work, going to the gym, and playing golf without pain.    Currently in Pain? No/denies      NDI: 0% (no limitations) Grip strength: L=75# , R=80#   OBJECTIVE:  There.ex.: Scifit L7 10 min. B UE/LE.  70# sled push and pull 70 feet in hallway x 4.  Nautilus: 50# scap. Retraction/ 50# lat. Pull downs in sitting/ 30# sh. Extension/ 30# tricep ext. 20x each. Standing 6# bicep curls/ press-ups 20x each. 35# floor to waist box lifting/ B carrying/ waist to overhead lift.  Seated cervical/ B shoulder AROM (all planes)- no limitations.  No manual tx. Or modalities today.    Pt response for medical necessity: No increase c/o pain or numbness reported t/o tx. Progression today.  Pt. Doing better and will be discharged from PT if all goals met next tx.             PT Long Term Goals - 06/10/15 1939    PT LONG TERM GOAL #1   Title Pt will report less than 10% on NDI self-percieved questionnaire to improve pt's percieved level of disability   Baseline 0% on 5/1   Time 4   Period Weeks   Status Achieved   PT LONG TERM GOAL #2   Title Pt will demonstrate 80lbs L grip strength with dynamometer to improve equality of pt's B grip strength.   Time 4   Period Weeks   Status Partially Met   PT LONG TERM GOAL #3   Title Pt will demonstrate good static and dynamic posture for 2 treatment sessions requiring less than 2 v.c. to improve pt's static/dynamic posture during function.   Time 4   Period Weeks   Status Partially Met   PT LONG TERM GOAL #4   Title Pt will report returning to work at [employer] full time with no episodes of increase pain to improve pt's ability to work.   Time 4   Period Weeks   Status On-going               Plan - 06/10/15 1930    Clinical Impression Statement Pt. able to progress with increase resistance/ wt. during strength training and return to work activities with  no pain/ numbness.  Full cervical/ B shoulder AROM (all planes).  PT recommends pt. play golf prior to next tx. to reassess return to prior level of function.  Probable discharge next visit if no pain or limitations.     Rehab Potential Good   PT Frequency 2x / week   PT Duration 4 weeks   PT Treatment/Interventions Therapeutic activities;Therapeutic exercise;Neuromuscular re-education;Patient/family education;Manual techniques;Passive range of motion;Dry needling   PT Next Visit Plan Dry needling to UT trigger points.  Incorporate work-related tasks.       PT Home Exercise Plan Pt given scap retractions, cervical retractions, doorway stretch, trap stretch, and levator stretch.    Consulted and Agree with Plan of Care Patient      Patient will benefit from skilled therapeutic intervention in order to improve the following deficits and impairments:  Decreased  strength, Improper body mechanics, Postural dysfunction, Pain, Decreased safety awareness  Visit Diagnosis: Abnormal posture  Muscle weakness (generalized)  Pain In Left Arm     Problem List Patient Active Problem List   Diagnosis Date Noted  . Neck muscle strain 05/17/2015  . History of kidney stones 09/11/2014  . Eczema 09/11/2014  . Neck swelling 09/11/2014   Pura Spice, PT, DPT # [id number]   06/10/2015, 7:40 PM  [place] [facility] [address] [place], [place], [address] Phone: [phone]   Fax:  [phone]  Name: Mike Torres MRN: [id number] Date of Birth: Jun 20, 1995

## 2015-06-13 ENCOUNTER — Encounter: Payer: Self-pay | Admitting: Physical Therapy

## 2015-06-13 ENCOUNTER — Ambulatory Visit: Payer: BLUE CROSS/BLUE SHIELD | Admitting: Physical Therapy

## 2015-06-13 DIAGNOSIS — R293 Abnormal posture: Secondary | ICD-10-CM

## 2015-06-13 DIAGNOSIS — M79602 Pain in left arm: Secondary | ICD-10-CM

## 2015-06-13 DIAGNOSIS — M6281 Muscle weakness (generalized): Secondary | ICD-10-CM

## 2015-06-13 NOTE — Therapy (Signed)
[facility] [address]. [place], [place], [address] Phone: [phone]   Fax:  [phone]  Physical Therapy Treatment  Patient Details  Name: Mike Torres MRN: [id number] Date of Birth: 1995-07-01 Referring Provider: Margarita Rana  Encounter Date: 06/13/2015      PT End of Session - 06/13/15 1436    Visit Number 6   Number of Visits 8   Date for PT Re-Evaluation 06/25/15   PT Start Time 4193   PT Stop Time 1520   PT Time Calculation (min) 52 min   Activity Tolerance Patient tolerated treatment well;No increased pain   Behavior During Therapy [facility] for tasks assessed/performed      History reviewed. No pertinent past medical history.  Past Surgical History  Procedure Laterality Date  . Hydrocele excision / repair    . Tympanostomy tube placement  1996    There were no vitals filed for this visit.      Subjective Assessment - 06/13/15 1432    Subjective Pt. states he is doing well.  No c/o pain or numbness.  Pt. unable to play golf this week and hoping to play in a golf tournament this week.  Pt. returns to MD tomorrow and hopes to return to work on Monday.     Limitations Other (comment)   Patient Stated Goals To return back to work, going to the gym, and playing golf without pain.    Currently in Pain? No/denies        OBJECTIVE: There.ex.:Nautilus: 50# scap. Retraction/ 50# lat. Pull downs in sitting/ 30# sh. Extension/ 30# tricep ext./ 40# shoulder adduction/ 40# shoulder press-outs/ 30# bicep curls  20x2 each. Scifit L7 10 min. B UE/LE. 70# sled push and pull 70 feet in hallway x 4. 35# to 50# floor to waist box lifting/ B carrying/ waist to overhead lift. Seated cervical/ B shoulder AROM (all planes)- no limitations.  Reviewed all goals.  Discuss return to gym based ex./ work-related tasks.     Pt response for medical necessity: No increase c/o pain or numbness reported t/o tx. Progression  today. Pt. Doing better and will be discharged from PT at this time.           PT Long Term Goals - 06/14/15 1314    PT LONG TERM GOAL #1   Baseline 0% on 5/1   Time 4   Period Weeks   Status Achieved   PT LONG TERM GOAL #2   Title Pt will demonstrate 80lbs L grip strength with dynamometer to improve equality of pt's B grip strength.   Baseline R: 80.1 and L: 71 on 05/27/2015   Time 4   Period Weeks   Status Partially Met   PT LONG TERM GOAL #3   Title Pt will demonstrate good static and dynamic posture for 2 treatment sessions requiring less than 2 v.c. to improve pt's static/dynamic posture during function.   Time 4   Period Weeks   Status Achieved   PT LONG TERM GOAL #4   Title Pt will report returning to work at [employer] full time with no episodes of increase pain to improve pt's ability to work.   Baseline pt. hoping to return to work next week.    Time 4   Period Weeks   Status On-going               Plan - 06/14/15 1312    Clinical Impression Statement Pt. has progressed well  with all aspects of PT with no c/o neck/head numbness over past week.  Pt. able to lifting/ carry >50# with no limitations.  All PT goals met at this time.  Pt. able to return to work without restriction at this time.  Pt. instructed to contact PT if any further issues present.    Rehab Potential Good   PT Frequency 2x / week   PT Duration 4 weeks   PT Treatment/Interventions Therapeutic activities;Therapeutic exercise;Neuromuscular re-education;Patient/family education;Manual techniques;Passive range of motion;Dry needling   PT Next Visit Plan Discharge   PT Home Exercise Plan Pt given scap retractions, cervical retractions, doorway stretch, trap stretch, and levator stretch.       Patient will benefit from skilled therapeutic intervention in order to improve the following deficits and impairments:  Decreased strength, Improper body mechanics, Postural dysfunction, Pain, Decreased safety  awareness  Visit Diagnosis: Abnormal posture  Muscle weakness (generalized)  Pain In Left Arm     Problem List Patient Active Problem List   Diagnosis Date Noted  . Neck muscle strain 05/17/2015  . History of kidney stones 09/11/2014  . Eczema 09/11/2014  . Neck swelling 09/11/2014   Pura Spice, PT, DPT # [id number]   06/14/2015, 1:16 PM  [facility] [address] [place], [place], [address] Phone: [phone]   Fax:  [phone]  Name: KAELAN AMBLE MRN: [id number] Date of Birth: 01/09/1996

## 2015-06-14 ENCOUNTER — Ambulatory Visit (INDEPENDENT_AMBULATORY_CARE_PROVIDER_SITE_OTHER): Payer: BLUE CROSS/BLUE SHIELD | Admitting: Family Medicine

## 2015-06-14 ENCOUNTER — Encounter: Payer: Self-pay | Admitting: Family Medicine

## 2015-06-14 VITALS — BP 122/70 | HR 76 | Temp 98.3°F | Resp 16 | Wt 197.0 lb

## 2015-06-14 DIAGNOSIS — S161XXD Strain of muscle, fascia and tendon at neck level, subsequent encounter: Secondary | ICD-10-CM | POA: Diagnosis not present

## 2015-06-14 NOTE — Progress Notes (Signed)
Subjective:    Patient ID: Mike Torres, male    DOB: Sep 01, 1995, 20 y.o.   MRN: [id number]  HPI   Follow up for Neck Muscle Strain  The patient was last seen for this 2 weeks ago. Changes made at last visit include ordering MRI, which will be preformed on 06/20/2015. Pt is also going to PT for this problem.  He reports excellent compliance with treatment. He feels that condition is Improved. Pt reports he is 100% improved. States there has not been any more numbness. Pt states he is ready to return to work on Monday. PT advised pt he can return to work.  ------------------------------------------------------------------------------------     Review of Systems  Constitutional: Negative for fever, chills, diaphoresis, activity change, appetite change, fatigue and unexpected weight change.  Musculoskeletal: Negative for neck pain and neck stiffness.  Neurological: Negative for dizziness, weakness, numbness and headaches.   BP 122/70 mmHg  Pulse 76  Temp(Src) 98.3 F (36.8 C) (Oral)  Resp 16  Wt 197 lb (89.359 kg)   Patient Active Problem List   Diagnosis Date Noted  . Neck muscle strain 05/17/2015  . History of kidney stones 09/11/2014  . Eczema 09/11/2014  . Neck swelling 09/11/2014   No past medical history on file. Current Outpatient Prescriptions on File Prior to Visit  Medication Sig  . cyclobenzaprine (FLEXERIL) 5 MG tablet Take 1 tablet (5 mg total) by mouth at bedtime.  . naproxen (NAPROSYN) 500 MG tablet Take 1 tablet (500 mg total) by mouth 2 (two) times daily with a meal.  . triamcinolone cream (KENALOG) 0.1 % Apply 1 application topically 2 (two) times daily.   No current facility-administered medications on file prior to visit.   No Known Allergies Past Surgical History  Procedure Laterality Date  . Hydrocele excision / repair    . Tympanostomy tube placement  1996   Social History   Social History  . Marital Status: Single    Spouse Name: N/A    . Number of Children: N/A  . Years of Education: H/S   Occupational History  .  [employer]    Full - Time   Social History Main Topics  . Smoking status: Never Smoker   . Smokeless tobacco: Never Used  . Alcohol Use: No  . Drug Use: No  . Sexual Activity: Not on file   Other Topics Concern  . Not on file   Social History Narrative   Family History  Problem Relation Age of Onset  . Healthy Mother   . Hypertension Father   . Healthy Brother   . Diabetes Maternal Grandmother   . Melanoma Maternal Grandmother   . Breast cancer Maternal Grandmother   . Colon cancer Maternal Grandfather   . Healthy Brother       Objective:   Physical Exam  Constitutional: He appears well-developed and well-nourished.  Musculoskeletal: Normal range of motion.       Right shoulder: He exhibits normal range of motion and no tenderness.       Left shoulder: He exhibits normal range of motion and no tenderness.       Cervical back: He exhibits normal range of motion and no tenderness.       Thoracic back: He exhibits no tenderness.  Psychiatric: He has a normal mood and affect. His behavior is normal.   BP 122/70 mmHg  Pulse 76  Temp(Src) 98.3 F (36.8 C) (Oral)  Resp 16  Wt 197 lb (89.359 kg)  Assessment & Plan:  1. Neck muscle strain, subsequent encounter Improved. Will cancel MRI since numbness has resolved. Wrote note stating pt can return to full time employment without restrictions on Monday, 06/14/2015.   Patient seen and examined by Leo GrosserNancy J. Sherline Eberwein, MD, and note scribed by Allene DillonEmily Drozdowski, CMA.  I have reviewed the document for accuracy and completeness and I agree with above. Leo Grosser- Levon Penning J. Demetrious Rainford, MD   Lorie PhenixNancy Kaleth Koy, MD

## 2015-06-17 ENCOUNTER — Encounter: Payer: BLUE CROSS/BLUE SHIELD | Admitting: Physical Therapy

## 2015-06-20 ENCOUNTER — Ambulatory Visit (INDEPENDENT_AMBULATORY_CARE_PROVIDER_SITE_OTHER): Payer: BLUE CROSS/BLUE SHIELD | Admitting: Family Medicine

## 2015-06-20 ENCOUNTER — Encounter: Payer: BLUE CROSS/BLUE SHIELD | Admitting: Physical Therapy

## 2015-06-20 ENCOUNTER — Ambulatory Visit: Payer: BLUE CROSS/BLUE SHIELD

## 2015-06-20 ENCOUNTER — Encounter: Payer: Self-pay | Admitting: Family Medicine

## 2015-06-20 VITALS — BP 128/80 | HR 84 | Temp 98.4°F | Resp 16 | Wt 193.0 lb

## 2015-06-20 DIAGNOSIS — S161XXD Strain of muscle, fascia and tendon at neck level, subsequent encounter: Secondary | ICD-10-CM

## 2015-06-20 DIAGNOSIS — R202 Paresthesia of skin: Secondary | ICD-10-CM | POA: Diagnosis not present

## 2015-06-20 NOTE — Progress Notes (Signed)
Subjective:    Patient ID: Mike Torres, male    DOB: 1995/07/06, [age] y.o.   MRN: [id number]  Neck Pain  This is a recurrent problem. Episode onset: recurred on Tuesday after lifting a heavy package at work. The pain is associated with lifting a heavy object. The pain is at a severity of 0/10. The patient is experiencing no pain. Worse during: worse after work. Associated symptoms include numbness (on top of head) and tingling. Pertinent negatives include no chest pain, headaches, leg pain, syncope, visual change, weakness or weight loss. He has tried muscle relaxants and NSAIDs for the symptoms. The treatment provided moderate relief.      Review of Systems  Constitutional: Negative for weight loss.  Cardiovascular: Negative for chest pain and syncope.  Musculoskeletal: Positive for neck pain.  Neurological: Positive for tingling and numbness (on top of head). Negative for weakness and headaches.   BP 128/80 mmHg  Pulse 84  Temp(Src) 98.4 F (36.9 C) (Oral)  Resp 16  Wt 193 lb (87.544 kg)   Patient Active Problem List   Diagnosis Date Noted  . Neck muscle strain 05/17/2015  . History of kidney stones 09/11/2014  . Eczema 09/11/2014  . Neck swelling 09/11/2014   No past medical history on file. Current Outpatient Prescriptions on File Prior to Visit  Medication Sig  . cyclobenzaprine (FLEXERIL) 5 MG tablet Take 1 tablet (5 mg total) by mouth at bedtime.  . naproxen (NAPROSYN) 500 MG tablet Take 1 tablet (500 mg total) by mouth 2 (two) times daily with a meal.  . triamcinolone cream (KENALOG) 0.1 % Apply 1 application topically 2 (two) times daily.   No current facility-administered medications on file prior to visit.   No Known Allergies Past Surgical History  Procedure Laterality Date  . Hydrocele excision / repair    . Tympanostomy tube placement  1996   Social History   Social History  . Marital Status: Single    Spouse Name: N/A  . Number of Children: N/A  .  Years of Education: H/S   Occupational History  .  [employer]    Full - Time   Social History Main Topics  . Smoking status: Never Smoker   . Smokeless tobacco: Never Used  . Alcohol Use: No  . Drug Use: No  . Sexual Activity: Not on file   Other Topics Concern  . Not on file   Social History Narrative   Family History  Problem Relation Age of Onset  . Healthy Mother   . Hypertension Father   . Healthy Brother   . Diabetes Maternal Grandmother   . Melanoma Maternal Grandmother   . Breast cancer Maternal Grandmother   . Colon cancer Maternal Grandfather   . Healthy Brother        Objective:   Physical Exam  Constitutional: He appears well-developed and well-nourished.  Musculoskeletal: He exhibits no edema.  No tenderness  Neurological:  Slight decreased reflexes bilateral upper extremities, unchanged from previous exam.  Skin:  Paresthesia right posterior scalp.    Psychiatric: He has a normal mood and affect. His behavior is normal.  BP 128/80 mmHg  Pulse 84  Temp(Src) 98.4 F (36.9 C) (Oral)  Resp 16  Wt 193 lb (87.544 kg)     Assessment & Plan:  1. Neck muscle strain, subsequent encounter Worsening. Recurrent problem. Has head paresthesia again.   Reorder MRI as below. Pt already called PT, and has appointment scheduled for Monday. Continue  current medications and plan of care. FU pending MRI results. Out of work until MRI read.   - MR Cervical Spine Wo Contrast; Future   2. Paresthesia As above.     Patient seen and examined by Leo Grosser, MD, and note scribed by Allene Dillon, CMA.  I have reviewed the document for accuracy and completeness and I agree with above. Leo Grosser, MD

## 2015-06-24 ENCOUNTER — Ambulatory Visit
Admission: RE | Admit: 2015-06-24 | Discharge: 2015-06-24 | Disposition: A | Discharge status: Home / Self Care | Payer: BLUE CROSS/BLUE SHIELD | Source: Ambulatory Visit | Attending: Family Medicine | Admitting: Family Medicine

## 2015-06-24 ENCOUNTER — Telehealth: Payer: Self-pay

## 2015-06-24 ENCOUNTER — Encounter: Payer: BLUE CROSS/BLUE SHIELD | Admitting: Physical Therapy

## 2015-06-24 DIAGNOSIS — S161XXD Strain of muscle, fascia and tendon at neck level, subsequent encounter: Secondary | ICD-10-CM

## 2015-06-24 DIAGNOSIS — R200 Anesthesia of skin: Secondary | ICD-10-CM | POA: Insufficient documentation

## 2015-06-24 DIAGNOSIS — M5030 Other cervical disc degeneration, unspecified cervical region: Secondary | ICD-10-CM | POA: Insufficient documentation

## 2015-06-24 DIAGNOSIS — S161XXA Strain of muscle, fascia and tendon at neck level, initial encounter: Secondary | ICD-10-CM | POA: Insufficient documentation

## 2015-06-24 NOTE — Telephone Encounter (Signed)
Did he say anything about work?  We have forms that need to be filled out. Thanks.

## 2015-06-24 NOTE — Telephone Encounter (Signed)
Advised patient as below. Patient reports that he went to the gym today to try to lift and he reports that as soon as he lift weights, he experienced numbness on his right side. Patient reports that the numbness doesn't last as long as it did, but he still has numbness. Patient wants to think about the neurology referral. He thinks he may be a little better, but he will call us if symptoms does not improve to schedule referral.

## 2015-06-24 NOTE — Telephone Encounter (Signed)
-----   Message from Lorie PhenixNancy Maloney, MD sent at 06/24/2015 12:26 PM EDT ----- MRI with mild DDD, no cause for symptoms identified. Can refer to neurology for further treatment and plan. Also have work paper work. How is he feeling and we can keep out of work until specialist referral  If not improved. Thanks.

## 2015-06-25 NOTE — Telephone Encounter (Signed)
Yes and filled out forms. Thanks.

## 2015-06-25 NOTE — Telephone Encounter (Signed)
Dr. Judie PetitM, did you speak with mother yesterday about the forms?

## 2015-07-29 ENCOUNTER — Telehealth: Payer: Self-pay | Admitting: Family Medicine

## 2015-07-29 NOTE — Telephone Encounter (Signed)
This would have been copied and sent to be scanned. Do you have the copy on your desk? Allene DillonEmily Drozdowski, CMA

## 2015-07-29 NOTE — Telephone Encounter (Signed)
Pt calling requesting a copy of work note that he received on his last visit if possible, pt states he would like to come pick up a copy this evening before we close, pt would like a return call. Thanks CC

## 2015-07-30 ENCOUNTER — Ambulatory Visit (INDEPENDENT_AMBULATORY_CARE_PROVIDER_SITE_OTHER): Payer: BLUE CROSS/BLUE SHIELD | Admitting: Family Medicine

## 2015-07-30 ENCOUNTER — Encounter: Payer: Self-pay | Admitting: Family Medicine

## 2015-07-30 VITALS — BP 130/80 | HR 88 | Temp 98.8°F | Resp 16 | Wt 202.0 lb

## 2015-07-30 DIAGNOSIS — R202 Paresthesia of skin: Secondary | ICD-10-CM

## 2015-07-30 DIAGNOSIS — S161XXD Strain of muscle, fascia and tendon at neck level, subsequent encounter: Secondary | ICD-10-CM

## 2015-07-30 NOTE — Progress Notes (Signed)
       [name] Male    DOB: [birth year]   20 y.o.   MRN: [id number] Visit Date: 07/30/2015  Today's Provider: Lorie PhenixNancy Saryah Loper, MD   Chief Complaint  Patient presents with  . Neck Pain    follow-up   Subjective:    Neck Pain  The current episode started more than 1 month ago. The pain is at a severity of 0/10. The patient is experiencing no pain. Pertinent negatives include no chest pain, headaches, leg pain, numbness, syncope, tingling, visual change, weakness or weight loss. The treatment provided significant relief.  Patient reports that he able to lift 95 to 100 pounds with out experncing any pain for over a week. Feels like he is doing much better.  Patient reports that he has not had to take any medications.  Did have a problem at work with disability. Did not go thru [employer], just thru [employer]. Did not know he was supposed to go thru both.       No Known Allergies Current Meds  Medication Sig  . triamcinolone cream (KENALOG) 0.1 % Apply 1 application topically 2 (two) times daily.    Review of Systems  Constitutional: Negative.  Negative for weight loss.  Cardiovascular: Negative.  Negative for chest pain and syncope.  Musculoskeletal: Negative for neck pain.  Neurological: Negative for tingling, weakness, numbness and headaches.    Social History  Substance Use Topics  . Smoking status: Never Smoker   . Smokeless tobacco: Never Used  . Alcohol Use: No   Objective:   BP 130/80 mmHg  Pulse 88  Temp(Src) 98.8 F (37.1 C) (Oral)  Resp 16  Wt 202 lb (91.627 kg)  Physical Exam  Constitutional: He is oriented to person, place, and time. He appears well-developed and well-nourished.  Musculoskeletal: Normal range of motion. He exhibits no edema or tenderness.  Neurological: He is alert and oriented to person, place, and time.  Psychiatric: He has a normal mood and affect. His behavior is normal. Judgment and thought content normal.      Assessment &  Plan:     1. Neck muscle strain, subsequent encounter Resolved. 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   Patient instructed to call back if condition worsens or recurs.    2. Paresthesia As above.      Patient seen and examined by Dr. Leo GrosserNancy J.. Rosette Bellavance, and note scribed by Liz BeachSulibeya S. Dimas, CMA.  I have reviewed the document for accuracy and completeness and I agree with above. - Leo GrosserNancy J. Geroge Gilliam, MD   Lorie PhenixNancy Letetia Romanello, MD  [facility]

## 2015-08-07 ENCOUNTER — Ambulatory Visit: Payer: BLUE CROSS/BLUE SHIELD | Admitting: Family Medicine

## 2016-12-03 ENCOUNTER — Ambulatory Visit (INDEPENDENT_AMBULATORY_CARE_PROVIDER_SITE_OTHER): Payer: BLUE CROSS/BLUE SHIELD | Admitting: Family Medicine

## 2016-12-03 ENCOUNTER — Encounter: Payer: Self-pay | Admitting: Family Medicine

## 2016-12-03 VITALS — BP 120/80 | HR 68 | Temp 97.7°F | Resp 16 | Wt 193.0 lb

## 2016-12-03 DIAGNOSIS — L301 Dyshidrosis [pompholyx]: Secondary | ICD-10-CM | POA: Diagnosis not present

## 2016-12-03 DIAGNOSIS — Z23 Encounter for immunization: Secondary | ICD-10-CM

## 2016-12-03 MED ORDER — CLOBETASOL PROPIONATE 0.05 % EX OINT: 1.0000 "application " | TOPICAL_OINTMENT | Freq: Two times a day (BID) | CUTANEOUS | 0 refills | Status: DC

## 2016-12-03 NOTE — Assessment & Plan Note (Signed)
Rash c/w dyshidrotic eczema Could be related to contact with chemicals/boxes or moisture Clobetasol ointment BID until resolution Wear gloves while working Return precautions discussed

## 2016-12-03 NOTE — Patient Instructions (Signed)
Hand Dermatitis  Hand dermatitis is a skin condition that causes small, itchy, raised dots or fluid-filled blisters to form over the palms of the hands. This condition may also be called hand eczema.  What are the causes?  The cause of this condition is not known.  What increases the risk?  This condition is more likely to develop in people who have a history of allergies, such as:   Hay fever.   Allergic asthma.   An allergy to latex.    Chemical exposure, injuries, and environmental irritants can make hand dermatitis worse. Washing your hands too often can remove natural oils, which can dry out the skin and contribute to outbreaks of this condition.  What are the signs or symptoms?  The most common symptom of this condition is intense itchiness. Cracks or grooves (fissures) on the fingers can also develop. Affected areas can be painful, especially areas where large blisters have formed.  How is this diagnosed?  This condition is diagnosed with a medical history and physical exam.  How is this treated?  This condition is treated with medicines, including:   Steroid creams and ointments.   Oral steroid medicines.   Antibiotic medicines. These are prescribed if you have an infection.   Antihistamine medicines. These help to reduce itchiness.    Follow these instructions at home:   Take or apply over-the-counter and prescription medicines only as told by your health care provider.   If you were prescribed an antibiotic medicine, use it as told by your health care provider. Do not stop using the antibiotic even if you start to feel better.   Avoid washing your hands more often than necessary.   Avoid using harsh chemicals on your hands.   Wear protective gloves when you handle products that can irritate your skin.   Keep all follow-up visits as told by your health care provider. This is important.  Contact a health care provider if:   Your rash does not improve during the first week of treatment.   Your  rash is red or tender.   Your rash has pus coming from it.   Your rash spreads.  This information is not intended to replace advice given to you by your health care provider. Make sure you discuss any questions you have with your health care provider.  Document Released: 01/26/2005 Document Revised: 07/04/2015 Document Reviewed: 08/10/2014  Elsevier Interactive Patient Education  2018 Elsevier Inc.

## 2016-12-03 NOTE — Progress Notes (Signed)
[name] Male    DOB: 1995-11-09   21 y.o.   MRN: [id number] Visit Date: 12/03/2016  Today's Provider: Shirlee LatchAngela Porschia Willbanks, MD   Chief Complaint  Patient presents with  . Rash   Subjective:    Rash  This is a new problem. Episode onset: x 4 days. The problem is unchanged. Location: bilateral palms of hands. The rash is characterized by redness, dryness and peeling. Associated with: works for [employer] and believes that this has been caused by coming into contact with boxes that may have had chemicals, etc in the boxes. Pertinent negatives include no anorexia, congestion, cough, diarrhea, eye pain, facial edema, fatigue, fever, joint pain, nail changes, rhinorrhea, shortness of breath, sore throat or vomiting. Past treatments include topical steroids. The treatment provided no relief. His past medical history is significant for allergies and eczema. There is no history of asthma.       No Known Allergies   Current Outpatient Prescriptions:  .  clobetasol ointment (TEMOVATE) 0.05 %, Apply 1 application topically 2 (two) times daily. To hands, Disp: 30 g, Rfl: 0 .  triamcinolone cream (KENALOG) 0.1 %, Apply 1 application topically 2 (two) times daily. (Patient not taking: Reported on 12/03/2016), Disp: 45 g, Rfl: 3  Review of Systems  Constitutional: Negative for fatigue and fever.  HENT: Negative for congestion, rhinorrhea and sore throat.   Eyes: Negative for pain.  Respiratory: Negative for cough and shortness of breath.   Gastrointestinal: Negative for anorexia, diarrhea and vomiting.  Musculoskeletal: Negative for joint pain.  Skin: Positive for rash. Negative for nail changes.    Social History  Substance Use Topics  . Smoking status: Never Smoker  . Smokeless tobacco: Never Used  . Alcohol use Yes     Comment: occasionally   Objective:   BP 120/80 (BP Location: Left Arm, Patient Position: Sitting, Cuff Size: Normal)   Pulse 68   Temp 97.7 F (36.5 C)  (Oral)   Resp 16   Wt 193 lb (87.5 kg)   BMI 24.45 kg/m  Vitals:   12/03/16 1330  BP: 120/80  Pulse: 68  Resp: 16  Temp: 97.7 F (36.5 C)  TempSrc: Oral  Weight: 193 lb (87.5 kg)     Physical Exam  Constitutional: He is oriented to person, place, and time. He appears well-developed and well-nourished. No distress.  HENT:  Head: Normocephalic and atraumatic.  Cardiovascular: Normal rate, regular rhythm and normal heart sounds.   Pulmonary/Chest: Effort normal and breath sounds normal. No respiratory distress.  Neurological: He is alert and oriented to person, place, and time.  Skin: Skin is warm and dry.  Peeling skin with rawness underneath on b/l palms, no surrounding erythema  Vitals reviewed.       Assessment & Plan:      Problem List Items Addressed This Visit      Musculoskeletal and Integument   Dyshidrotic eczema - Primary    Rash c/w dyshidrotic eczema Could be related to contact with chemicals/boxes or moisture Clobetasol ointment BID until resolution Wear gloves while working Return precautions discussed        Meds ordered this encounter  Medications  . clobetasol ointment (TEMOVATE) 0.05 %    Sig: Apply 1 application topically 2 (two) times daily. To hands    Dispense:  30 g    Refill:  0    Return in about 3 months (around 03/05/2017) for CPE.     The  entirety of the information documented in the History of Present Illness, Review of Systems and Physical Exam were personally obtained by me. Portions of this information were initially documented by Irving Burton Ratchford, CMA and reviewed by me for thoroughness and accuracy.     Shirlee Latch, MD  [facility]

## 2017-02-25 ENCOUNTER — Ambulatory Visit: Payer: BLUE CROSS/BLUE SHIELD | Admitting: Physician Assistant

## 2017-02-25 ENCOUNTER — Encounter: Payer: Self-pay | Admitting: Physician Assistant

## 2017-02-25 VITALS — BP 126/90 | HR 72 | Temp 97.7°F | Resp 16 | Ht 75.0 in | Wt 194.8 lb

## 2017-02-25 DIAGNOSIS — S335XXA Sprain of ligaments of lumbar spine, initial encounter: Secondary | ICD-10-CM | POA: Diagnosis not present

## 2017-02-25 LAB — POCT URINALYSIS DIPSTICK
BILIRUBIN UA: NEGATIVE
Glucose, UA: NEGATIVE
KETONES UA: NEGATIVE
Leukocytes, UA: NEGATIVE
Nitrite, UA: NEGATIVE
PH UA: 7 (ref 5.0–8.0)
Protein, UA: NEGATIVE
RBC UA: NEGATIVE
SPEC GRAV UA: 1.02 (ref 1.010–1.025)
Urobilinogen, UA: 0.2 E.U./dL

## 2017-02-25 NOTE — Progress Notes (Signed)
       [name] Male    DOB: 08/02/1995   22 y.o.   MRN: [id number] Visit Date: 02/25/2017  Today's Provider: Margaretann LovelessJennifer M Arriel Victor, PA-C   Chief Complaint  Patient presents with  . Back Pain   Subjective:    HPI Patient here today C/O possible kidney stone, pt reports some soreness on left lower back x's 3 days. Patient reports having kidney stones in the past. Patient reports that he did take Advil for pain and reports good pain control. Patient denies any painful urination or blood in urine. He does report symptoms have been improving.     No Known Allergies   Current Outpatient Medications:  .  clobetasol ointment (TEMOVATE) 0.05 %, Apply 1 application topically 2 (two) times daily. To hands, Disp: 30 g, Rfl: 0  Review of Systems  Constitutional: Negative.   Respiratory: Negative.   Cardiovascular: Negative.   Gastrointestinal: Negative.   Genitourinary: Negative.   Musculoskeletal: Positive for back pain.  Neurological: Negative.     Social History   Tobacco Use  . Smoking status: Never Smoker  . Smokeless tobacco: Never Used  Substance Use Topics  . Alcohol use: Yes    Comment: occasionally   Objective:   BP 126/90 (BP Location: Left Arm, Patient Position: Sitting, Cuff Size: Normal)   Pulse 72   Temp 97.7 F (36.5 C) (Oral)   Resp 16   Ht 6\' 3"  (1.905 m)   Wt 194 lb 12.8 oz (88.4 kg)   SpO2 99%   BMI 24.35 kg/m  Vitals:   02/25/17 1430  BP: 126/90  Pulse: 72  Resp: 16  Temp: 97.7 F (36.5 C)  TempSrc: Oral  SpO2: 99%  Weight: 194 lb 12.8 oz (88.4 kg)  Height: 6\' 3"  (1.905 m)     Physical Exam  Constitutional: He is oriented to person, place, and time. He appears well-developed and well-nourished. No distress.  Cardiovascular: Normal rate, regular rhythm and normal heart sounds. Exam reveals no gallop and no friction rub.  No murmur heard. Pulmonary/Chest: Effort normal and breath sounds normal. No respiratory distress. He has  no wheezes. He has no rales.  Abdominal: Soft. Normal appearance and bowel sounds are normal. He exhibits no distension and no mass. There is no hepatosplenomegaly. There is no tenderness. There is no rebound, no guarding and no CVA tenderness.  Musculoskeletal:       Thoracic back: Normal.       Lumbar back: Normal.  Neurological: He is alert and oriented to person, place, and time.  Skin: Skin is warm and dry. He is not diaphoretic.  Vitals reviewed.       Assessment & Plan:     1. Sprain of low back, initial encounter UA normal. Suspect back sprain from working at [employer]. Improving with advil. May use heating pad as well. Back stretches given to patient. Call if symptoms worsen.  - POCT urinalysis dipstick       Margaretann LovelessJennifer M Anastassia Noack, PA-C  [facility]

## 2017-02-25 NOTE — Patient Instructions (Signed)
Low Back Sprain  A sprain is a stretch or tear in the bands of tissue that hold bones and joints together (ligaments). Sprains of the lower back (lumbar spine) are a common cause of low back pain. A sprain occurs when ligaments are overextended or stretched beyond their limits. The ligaments can become inflamed, resulting in pain and sudden muscle tightening (spasms). A sprain can be caused by an injury (trauma), or it can develop gradually due to overuse.  There are three types of sprains:  · Grade 1 is a mild sprain involving an overstretched ligament or a very slight tear of the ligament.  · Grade 2 is a moderate sprain involving a partial tear of the ligament.  · Grade 3 is a severe sprain involving a complete tear of the ligament.    What are the causes?  This condition may be caused by:  · Trauma, such as a fall or a hit to the body.  · Twisting or overstretching the back. This may result from doing activities that require a lot of energy, such as lifting heavy objects.    What increases the risk?  The following factors may increase your risk of getting this condition:  · Playing contact sports.  · Participating in sports or activities that put excessive stress on the back and require a lot of bending and twisting, including:  ? Lifting weights or heavy objects.  ? Gymnastics.  ? Soccer.  ? Figure skating.  ? Snowboarding.  · Being overweight or obese.  · Having poor strength and flexibility.    What are the signs or symptoms?  Symptoms of this condition may include:  · Sharp or dull pain in the lower back that does not go away. Pain may extend to the buttocks.  · Stiffness.  · Limited range of motion.  · Inability to stand up straight due to stiffness or pain.  · Muscle spasms.    How is this diagnosed?    This condition may be diagnosed based on:  · Your symptoms.  · Your medical history.  · A physical exam.  ? Your health care provider may push on certain areas of your back to determine the source of your  pain.  ? You may be asked to bend forward, backward, and side to side to assess the severity of your pain and your range of motion.  · Imaging tests, such as:  ? X-rays.  ? MRI.    How is this treated?  Treatment for this condition may include:  · Applying heat and cold to the affected area.  · Medicines to help relieve pain and to relax your muscles (muscle relaxants).  · NSAIDs to help reduce swelling and discomfort.  · Physical therapy.    When your symptoms improve, it is important to gradually return to your normal routine as soon as possible to reduce pain, avoid stiffness, and avoid loss of muscle strength. Generally, symptoms should improve within 6 weeks of treatment. However, recovery time varies.  Follow these instructions at home:  Managing pain, stiffness, and swelling  · If directed, apply ice to the injured area during the first 24 hours after your injury.  ? Put ice in a plastic bag.  ? Place a towel between your skin and the bag.  ? Leave the ice on for 20 minutes, 2-3 times a day.  · If directed, apply heat to the affected area as often as told by your health care provider. Use the   heat source that your health care provider recommends, such as a moist heat pack or a heating pad.  ? Place a towel between your skin and the heat source.  ? Leave the heat on for 20-30 minutes.  ? Remove the heat if your skin turns bright red. This is especially important if you are unable to feel pain, heat, or cold. You may have a greater risk of getting burned.  Activity  · Rest and return to your normal activities as told by your health care provider. Ask your health care provider what activities are safe for you.  · Avoid activities that take a lot of effort (are strenuous) for as long as told by your health care provider.  · Do exercises as told by your health care provider.  General instructions    · Take over-the-counter and prescription medicines only as told by your health care provider.  · If you have  questions or concerns about safety while taking pain medicine, talk with your health care provider.  · Do not drive or operate heavy machinery until you know how your pain medicine affects you.  · Do not use any tobacco products, such as cigarettes, chewing tobacco, and e-cigarettes. Tobacco can delay bone healing. If you need help quitting, ask your health care provider.  · Keep all follow-up visits as told by your health care provider. This is important.  How is this prevented?  · Warm up and stretch before being active.  · Cool down and stretch after being active.  · Give your body time to rest between periods of activity.  · Avoid:  ? Being physically inactive for long periods at a time.  ? Exercising or playing sports when you are tired or in pain.  · Use correct form when playing sports and lifting heavy objects.  · Use good posture when sitting and standing.  · Maintain a healthy weight.  · Sleep on a mattress with medium firmness to support your back.  · Make sure to use equipment that fits you, including shoes that fit well.  · Be safe and responsible while being active to avoid falls.  · Do at least 150 minutes of moderate-intensity exercise each week, such as brisk walking or water aerobics. Try a form of exercise that takes stress off your back, such as swimming or stationary cycling.  · Maintain physical fitness, including:  ? Strength. In particular, develop and maintain strong abdominal muscles.  ? Flexibility.  ? Cardiovascular fitness.  ? Endurance.  Contact a health care provider if:  · Your back pain does not improve after 6 weeks of treatment.  · Your symptoms get worse.  Get help right away if:  · Your back pain is severe.  · You are unable to stand or walk.  · You develop pain in your legs.  · You develop weakness in your buttocks or legs.  · You have difficulty controlling when you urinate or when you have a bowel movement.  This information is not intended to replace advice given to you by  your health care provider. Make sure you discuss any questions you have with your health care provider.  Document Released: 01/26/2005 Document Revised: 10/03/2015 Document Reviewed: 11/07/2014  Elsevier Interactive Patient Education © 2018 Elsevier Inc.

## 2017-03-22 ENCOUNTER — Encounter: Payer: Self-pay | Admitting: Physician Assistant

## 2017-03-22 ENCOUNTER — Ambulatory Visit (INDEPENDENT_AMBULATORY_CARE_PROVIDER_SITE_OTHER): Payer: BLUE CROSS/BLUE SHIELD | Admitting: Physician Assistant

## 2017-03-22 VITALS — BP 110/72 | HR 97 | Temp 98.3°F | Resp 16 | Ht 75.0 in | Wt 195.8 lb

## 2017-03-22 DIAGNOSIS — Z0000 Encounter for general adult medical examination without abnormal findings: Secondary | ICD-10-CM

## 2017-03-22 DIAGNOSIS — Z136 Encounter for screening for cardiovascular disorders: Secondary | ICD-10-CM | POA: Diagnosis not present

## 2017-03-22 DIAGNOSIS — Z23 Encounter for immunization: Secondary | ICD-10-CM | POA: Diagnosis not present

## 2017-03-22 DIAGNOSIS — Z131 Encounter for screening for diabetes mellitus: Secondary | ICD-10-CM | POA: Diagnosis not present

## 2017-03-22 DIAGNOSIS — Z13220 Encounter for screening for lipoid disorders: Secondary | ICD-10-CM

## 2017-03-22 NOTE — Patient Instructions (Signed)
Preventive Care for [place], Male The transition to life after high school as a young adult can be a stressful time with many changes. You may start seeing a primary care physician instead of a pediatrician. This is the time when your health care becomes your responsibility. Preventive care refers to lifestyle choices and visits with your health care provider that can promote health and wellness. What does preventive care include?  A yearly physical exam. This is also called an annual wellness visit.  Dental exams once or twice a year.  Routine eye exams. Ask your health care provider how often you should have your eyes checked.  Personal lifestyle choices, including: ? Daily care of your teeth and gums. ? Regular physical activity. ? Eating a healthy diet. ? Avoiding tobacco and drug use. ? Avoiding or limiting alcohol use. ? Practicing safe sex. What happens during an annual wellness visit? Preventive care starts with a yearly visit to your primary care physician. The services and screenings done by your health care provider during your annual wellness visit will depend on your overall health, lifestyle risk factors, and family history of disease. Counseling Your health care provider may ask you questions about:  Past medical problems and your family's medical history.  Medicines or supplements that you take.  Health insurance and access to health care.  Alcohol, tobacco, and drug use, including use of any bodybuilding drugs (anabolic steroids).  Your safety at home, work, or school.  Access to firearms.  Emotional well-being and how you cope with stress.  Relationship well-being.  Diet, exercise, and sleep habits.  Your sexual health and activity.  Screening You may have the following tests or measurements:  Height, weight, and BMI.  Blood pressure.  Lipid and cholesterol levels.  Tuberculosis skin test.  Skin exam.  Vision and hearing tests.  Genital  exam to check for testicular cancer or hernias.  Screening test for hepatitis.  Screening tests for STDs (sexually transmitted diseases), if you are at risk.  Vaccines Your health care provider may recommend certain vaccines, such as:  Influenza vaccine. This is recommended every year.  Tetanus, diphtheria, and acellular pertussis (Tdap, Td) vaccine. You may need a Td booster every 10 years.  Varicella vaccine. You may need this if you have not been vaccinated.  HPV vaccine. If you are 8 or younger, you may need three doses over 6 months.  Measles, mumps, and rubella (MMR) vaccine. You may need at least one dose of MMR. You may also need a second dose.  Pneumococcal 13-valent conjugate (PCV13) vaccine. You may need this if you have certain conditions and have not been vaccinated.  Pneumococcal polysaccharide (PPSV23) vaccine. You may need one or two doses if you smoke cigarettes or if you have certain conditions.  Meningococcal vaccine. One dose is recommended if you are age 83-21 years and a first-year college student living in a residence hall, or if you have one of several medical conditions. You may also need additional booster doses.  Hepatitis A vaccine. You may need this if you have certain conditions or if you travel or work in places where you may be exposed to hepatitis A.  Hepatitis B vaccine. You may need this if you have certain conditions or if you travel or work in places where you may be exposed to hepatitis B.  Haemophilus influenzae type b (Hib) vaccine. You may need this if you have certain risk factors.  Talk to your health care provider about which  screenings and vaccines you need and how often you need them. What steps can I take to develop healthy behaviors?  Have regular preventive health care visits with your primary care physician and dentist.  Eat a healthy diet.  Drink enough fluid to keep your urine clear or pale yellow.  Stay active. Exercise at  least 30 minutes 5 or more days of the week.  Use alcohol responsibly.  Maintain a healthy weight.  Do not use any products that contain nicotine, such as cigarettes, chewing tobacco, and e-cigarettes. If you need help quitting, ask your health care provider.  Do not use drugs.  Practice safe sex. This includes using condoms to prevent STDs or an unwanted pregnancy.  Find healthy ways to manage stress. How can I protect myself from injury? Injuries from violence or accidents are the leading cause of death among young adults and can often be prevented. Take these steps to help protect yourself:  Always wear your seat belt while driving or riding in a vehicle.  Do not drive if you have been drinking alcohol. Do not ride with someone who has been drinking.  Do not drive when you are tired or distracted. Do not text while driving.  Wear a helmet and other protective equipment during sports activities.  If you have firearms in your house, make sure you follow all gun safety procedures.  Seek help if you have been bullied, physically abused, or sexually abused.  Avoid fighting.  Use the Internet responsibly to avoid dangers such as online bullying.  What can I do to cope with stress? Young adults may face many new challenges that can be stressful, such as finding a job, going to college, moving away from home, managing money, being in a relationship, getting married, and having children. To manage stress:  Avoid known stressful situations when you can.  Exercise regularly.  Find a stress-reducing activity that works best for you. Examples include meditation, yoga, listening to music, or reading.  Spend time in nature.  Keep a journal to write about your stress and how you respond.  Talk to your health care provider about stress. He or she may suggest counseling.  Spend time with supportive friends or family.  Do not cope with stress by: ? Drinking alcohol or using  drugs. ? Smoking cigarettes. ? Eating.  Where can I get more information? Learn more about preventive care and healthy habits from:  U.S. Preventive Services Task Force: StageSync.si  National Adolescent and [place]: StrategicRoad.nl  American Academy of Pediatrics Bright Futures: https://brightfutures.MemberVerification.co.za  Society for Adolescent Health and Medicine: MoralBlog.co.za.aspx  PodExchange.nl: ToyLending.fr  This information is not intended to replace advice given to you by your health care provider. Make sure you discuss any questions you have with your health care provider. Document Released: 06/13/2015 Document Revised: 07/04/2015 Document Reviewed: 06/13/2015 Elsevier Interactive Patient Education  [employer].

## 2017-03-22 NOTE — Progress Notes (Signed)
[name], Male    DOB: 08/08/1995, 22 y.o.   MRN: [id number] Visit Date: 03/22/2017  Today's Provider: Mar Daring, PA-C   Chief Complaint  Patient presents with  . Annual Exam   Subjective:    Annual physical exam Mike Torres is a 22 y.o. male who presents today for health maintenance and complete physical. He feels well. He reports exercising none. He reports he is sleeping fairly well. -----------------------------------------------------------------   Review of Systems  Constitutional: Negative.   HENT: Negative.   Eyes: Negative.   Respiratory: Negative.   Cardiovascular: Negative.   Gastrointestinal: Negative.   Endocrine: Negative.   Genitourinary: Negative.   Musculoskeletal: Negative.   Skin: Negative.   Allergic/Immunologic: Negative.   Neurological: Negative.   Hematological: Negative.   Psychiatric/Behavioral: Negative.     Social History      He  reports that  has never smoked. he has never used smokeless tobacco. He reports that he drinks alcohol. He reports that he does not use drugs.       Social History   Socioeconomic History  . Marital status: Single    Spouse name: None  . Number of children: None  . Years of education: H/S  . Highest education level: None  Social Needs  . Financial resource strain: None  . Food insecurity - worry: None  . Food insecurity - inability: None  . Transportation needs - medical: None  . Transportation needs - non-medical: None  Occupational History    Employer: [employer]    Comment: Full - Time  Tobacco Use  . Smoking status: Never Smoker  . Smokeless tobacco: Never Used  Substance and Sexual Activity  . Alcohol use: Yes    Comment: occasionally  . Drug use: No  . Sexual activity: None  Other Topics Concern  . None  Social History Narrative  . None    History reviewed. No pertinent past medical history.   Patient Active Problem List   Diagnosis Date Noted  . Dyshidrotic  eczema 12/03/2016  . Paresthesia 06/20/2015  . Neck muscle strain 05/17/2015  . History of kidney stones 09/11/2014  . Eczema 09/11/2014  . Neck swelling 09/11/2014    Past Surgical History:  Procedure Laterality Date  . HYDROCELE EXCISION / REPAIR    . TYMPANOSTOMY TUBE PLACEMENT  1996    Family History        Family Status  Relation Name Status  . Mother  Alive  . Father  Alive  . Brother  Alive  . MGM  Alive  . MGF  Alive  . Brother  Alive        His family history includes Breast cancer in his maternal grandmother; Colon cancer in his maternal grandfather; Diabetes in his maternal grandmother; Healthy in his brother, brother, and mother; Hypertension in his father; Melanoma in his maternal grandmother.      No Known Allergies   Current Outpatient Medications:  .  clobetasol ointment (TEMOVATE) 6.21 %, Apply 1 application topically 2 (two) times daily. To hands, Disp: 30 g, Rfl: 0   Patient Care Team: Virginia Crews, MD as PCP - General (Family Medicine)      Objective:   Vitals: BP 110/72 (BP Location: Left Arm, Patient Position: Sitting, Cuff Size: Normal)   Pulse 97   Temp 98.3 F (36.8 C) (Oral)   Resp 16   Ht '6\' 3"'$  (1.905 m)   Wt 195 lb  12.8 oz (88.8 kg)   BMI 24.47 kg/m    Physical Exam  Constitutional: He is oriented to person, place, and time. He appears well-developed and well-nourished.  HENT:  Head: Normocephalic and atraumatic.  Right Ear: Hearing, tympanic membrane, external ear and ear canal normal.  Left Ear: Hearing, tympanic membrane, external ear and ear canal normal.  Nose: Nose normal.  Mouth/Throat: Uvula is midline, oropharynx is clear and moist and mucous membranes are normal.  Eyes: Conjunctivae and EOM are normal. Pupils are equal, round, and reactive to light. Right eye exhibits no discharge.  Neck: Normal range of motion. Neck supple. No tracheal deviation present. No thyromegaly present.  Cardiovascular: Normal rate,  regular rhythm, normal heart sounds and intact distal pulses.  No murmur heard. Pulmonary/Chest: Effort normal and breath sounds normal. No respiratory distress. He has no wheezes. He has no rales. He exhibits no tenderness.  Abdominal: Soft. Bowel sounds are normal. He exhibits no distension and no mass. There is no tenderness. There is no rebound and no guarding.  Genitourinary:  Genitourinary Comments: Deferred per patient  Musculoskeletal: Normal range of motion. He exhibits no edema or tenderness.  Lymphadenopathy:    He has no cervical adenopathy.  Neurological: He is alert and oriented to person, place, and time. He has normal reflexes. No cranial nerve deficit. He exhibits normal muscle tone. Coordination normal.  Skin: Skin is warm and dry. No rash noted. No erythema.  Psychiatric: He has a normal mood and affect. His behavior is normal. Judgment and thought content normal.     Depression Screen PHQ 2/9 Scores 03/22/2017 02/25/2017  PHQ - 2 Score 0 0      Assessment & Plan:     Routine Health Maintenance and Physical Exam  Exercise Activities and Dietary recommendations Goals    None      Immunization History  Administered Date(s) Administered  . DTaP 05/02/1993, 07/09/1993, 08/29/1993, 05/28/1994, 04/25/1998  . Hepatitis A 10/01/2006, 07/29/2009  . Hepatitis B 17-Dec-1995, 05/02/1993, 12/05/1993  . HiB (PRP-OMP) 05/02/1993, 07/09/1993, 08/29/1993, 05/28/1994  . IPV 05/02/1993, 07/09/1993, 08/29/1993, 02/28/1999  . Influenza,inj,Quad PF,6+ Mos 12/03/2016  . MMR 04/25/1998, 02/26/2004  . Meningococcal Conjugate 07/29/2009  . Td 10/01/2006  . Tdap 10/01/2006  . Varicella 10/23/1994, 10/01/2006    Health Maintenance  Topic Date Due  . HIV Screening  03/03/2008  . TETANUS/TDAP  09/30/2016  . INFLUENZA VACCINE  Completed     Discussed health benefits of physical activity, and encouraged him to engage in regular exercise appropriate for his age and condition.      1. Annual physical exam Normal physical exam today. Will check labs as below and f/u pending lab results. If labs are stable and WNL he will not need to have these rechecked for one year at his next annual physical exam. He is to call the office in the meantime if he has any acute issue, questions or concerns. - CBC with Differential/Platelet - Comprehensive metabolic panel - TSH  2. Encounter for lipid screening for cardiovascular disease Will check labs as below and f/u pending results. - Lipid panel  3. Diabetes mellitus screening Will check labs as below and f/u pending results. - Hemoglobin A1c  4. Need for tetanus booster Td booster given to patient without complications. Patient sat for 15 minutes after administration and was tolerated well without adverse effects. - Td : Tetanus/diphtheria >7yo Preservative  free  --------------------------------------------------------------------    Mar Daring, PA-C  [facility]

## 2017-03-23 ENCOUNTER — Telehealth: Payer: Self-pay

## 2017-03-23 LAB — CBC WITH DIFFERENTIAL/PLATELET
BASOS: 0 %
Basophils Absolute: 0 10*3/uL (ref 0.0–0.2)
EOS (ABSOLUTE): 0.1 10*3/uL (ref 0.0–0.4)
Eos: 3 %
Hematocrit: 45.8 % (ref 37.5–51.0)
Hemoglobin: 15.4 g/dL (ref 13.0–17.7)
IMMATURE GRANS (ABS): 0 10*3/uL (ref 0.0–0.1)
IMMATURE GRANULOCYTES: 0 %
LYMPHS: 33 %
Lymphocytes Absolute: 1.7 10*3/uL (ref 0.7–3.1)
MCH: 31.6 pg (ref 26.6–33.0)
MCHC: 33.6 g/dL (ref 31.5–35.7)
MCV: 94 fL (ref 79–97)
Monocytes Absolute: 0.3 10*3/uL (ref 0.1–0.9)
Monocytes: 6 %
NEUTROS PCT: 58 %
Neutrophils Absolute: 3 10*3/uL (ref 1.4–7.0)
PLATELETS: 339 10*3/uL (ref 150–379)
RBC: 4.87 x10E6/uL (ref 4.14–5.80)
RDW: 13.1 % (ref 12.3–15.4)
WBC: 5.2 10*3/uL (ref 3.4–10.8)

## 2017-03-23 LAB — COMPREHENSIVE METABOLIC PANEL
A/G RATIO: 1.7 (ref 1.2–2.2)
ALT: 27 IU/L (ref 0–44)
AST: 18 IU/L (ref 0–40)
Albumin: 4.7 g/dL (ref 3.5–5.5)
Alkaline Phosphatase: 106 IU/L (ref 39–117)
BILIRUBIN TOTAL: 1 mg/dL (ref 0.0–1.2)
BUN/Creatinine Ratio: 10 (ref 9–20)
BUN: 10 mg/dL (ref 6–20)
CALCIUM: 9.5 mg/dL (ref 8.7–10.2)
CHLORIDE: 101 mmol/L (ref 96–106)
CO2: 24 mmol/L (ref 20–29)
Creatinine, Ser: 1.01 mg/dL (ref 0.76–1.27)
GFR calc Af Amer: 120 mL/min/{1.73_m2} (ref >59)
GFR calc non Af Amer: 104 mL/min/{1.73_m2} (ref >59)
GLUCOSE: 89 mg/dL (ref 65–99)
Globulin, Total: 2.7 g/dL (ref 1.5–4.5)
POTASSIUM: 4.3 mmol/L (ref 3.5–5.2)
Sodium: 138 mmol/L (ref 134–144)
TOTAL PROTEIN: 7.4 g/dL (ref 6.0–8.5)

## 2017-03-23 LAB — LIPID PANEL
Chol/HDL Ratio: 3.4 ratio (ref 0.0–5.0)
Cholesterol, Total: 175 mg/dL (ref 100–199)
HDL: 52 mg/dL (ref >39)
LDL Calculated: 102 mg/dL — ABNORMAL HIGH (ref 0–99)
Triglycerides: 103 mg/dL (ref 0–149)
VLDL Cholesterol Cal: 21 mg/dL (ref 5–40)

## 2017-03-23 LAB — TSH: TSH: 1.33 u[IU]/mL (ref 0.450–4.500)

## 2017-03-23 LAB — HEMOGLOBIN A1C
ESTIMATED AVERAGE GLUCOSE: 91 mg/dL
HEMOGLOBIN A1C: 4.8 % (ref 4.8–5.6)

## 2017-03-23 NOTE — Telephone Encounter (Signed)
-----   Message from Margaretann LovelessJennifer M Burnette, PA-C sent at 03/23/2017  8:36 AM EST ----- All labs are within normal limits and stable.  Thanks! -JB

## 2017-03-23 NOTE — Telephone Encounter (Signed)
Patient advised as below.  

## 2017-05-01 IMAGING — MR MR CERVICAL SPINE W/O CM
5 series · 35 of 48 positions shown · non-contrast
Comparison: Plain film cervical spine 05/14/2015.

CLINICAL DATA: Neck pain beginning 3 weeks ago with numbness around
the head. Symptoms improved after physical therapy but recurred
after lifting something heavy. Initial encounter.

EXAM:
MRI CERVICAL SPINE WITHOUT CONTRAST
TECHNIQUE: Multiplanar, multisequence MR imaging of the cervical spine was
performed. No intravenous contrast was administered.

[Series 2: T2 · sagittal · 3.0mm · 0.56mm/px · 8 of 15 slices shown (1 of 2)]
[im 1/15]
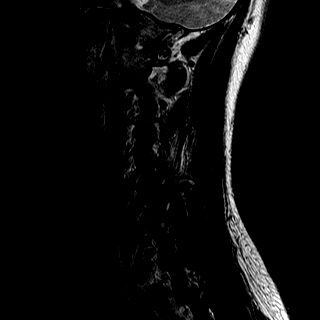
[im 3/15]
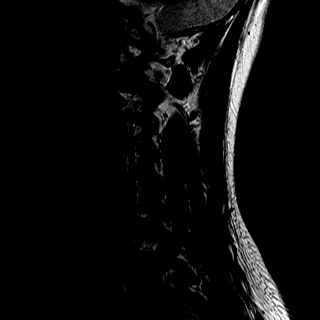
[im 5/15]
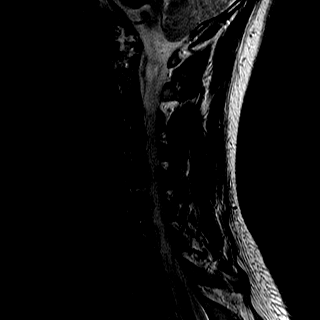
[im 7/15]
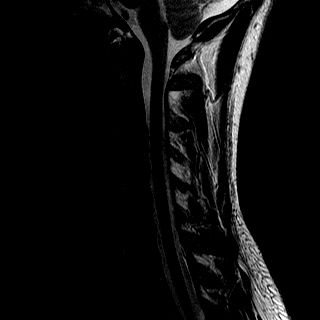
[im 9/15]
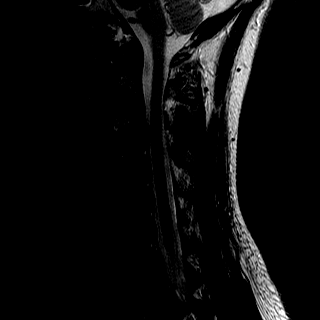
[im 11/15]
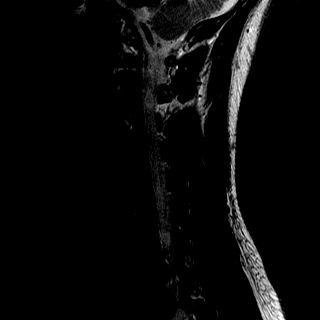
[im 13/15]
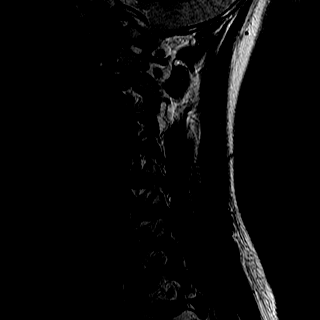
[im 15/15]
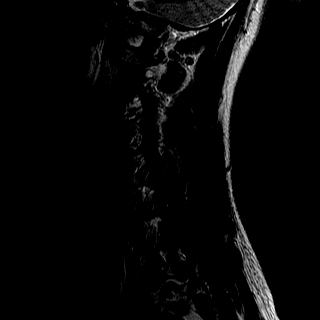

[Series 3: T1 · sagittal · 3.0mm · 0.70mm/px · 7 of 15 slices shown]
[im 1/15]
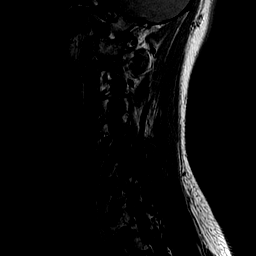
[im 3/15]
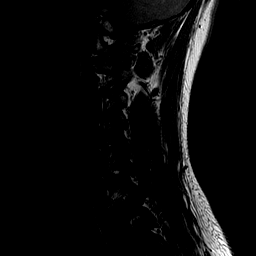
[im 5/15]
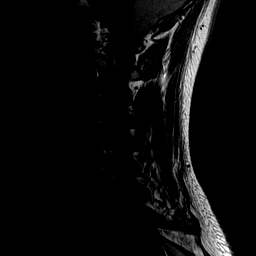
[im 8/15]
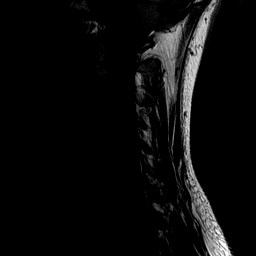
[im 10/15]
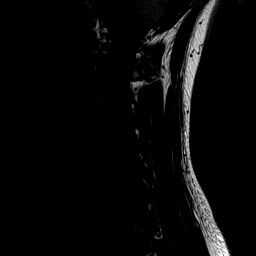
[im 12/15]
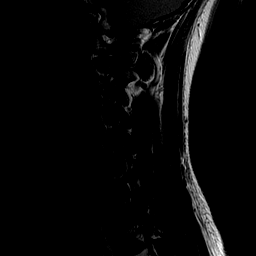
[im 15/15]
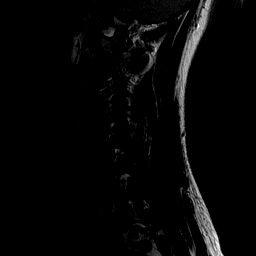

[Series 5: STIR · sagittal · 3.0mm · 0.35mm/px · 7 of 15 slices shown]
[im 1/15]
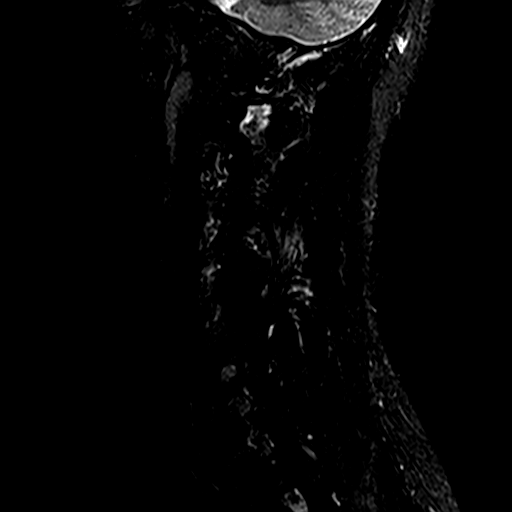
[im 3/15]
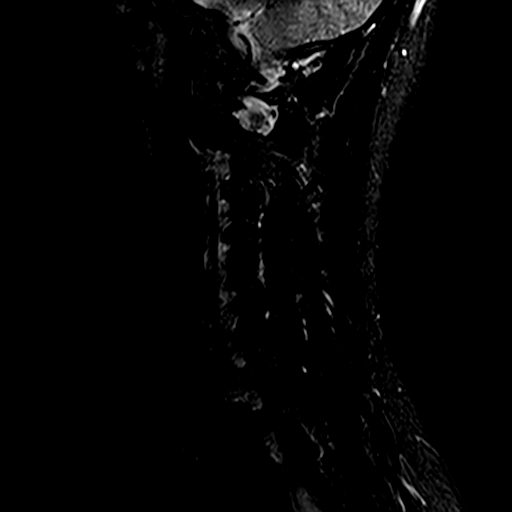
[im 5/15]
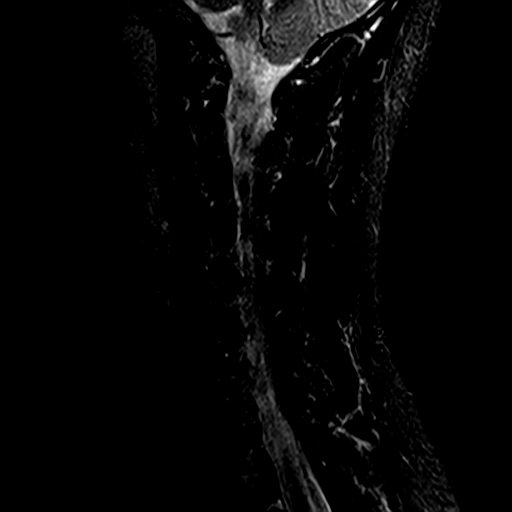
[im 8/15]
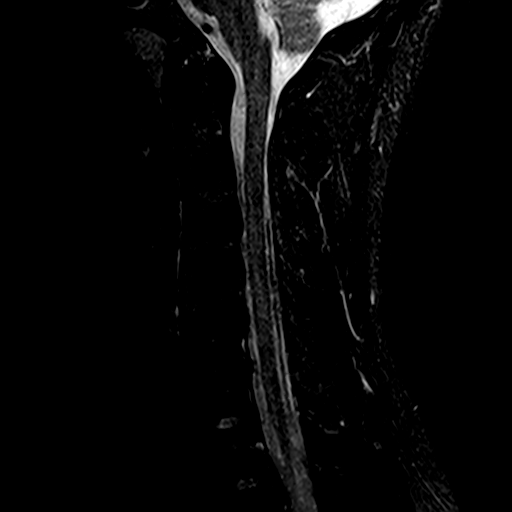
[im 10/15]
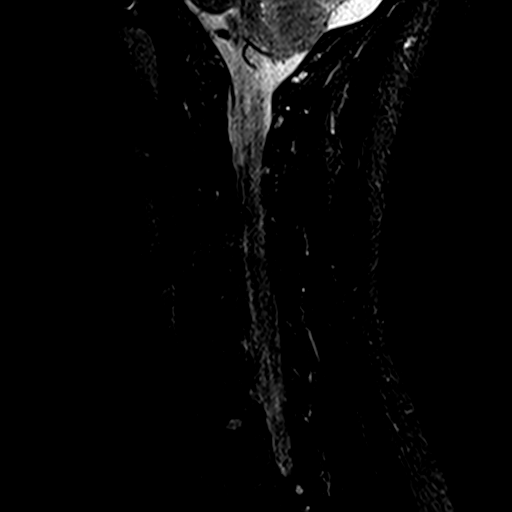
[im 12/15]
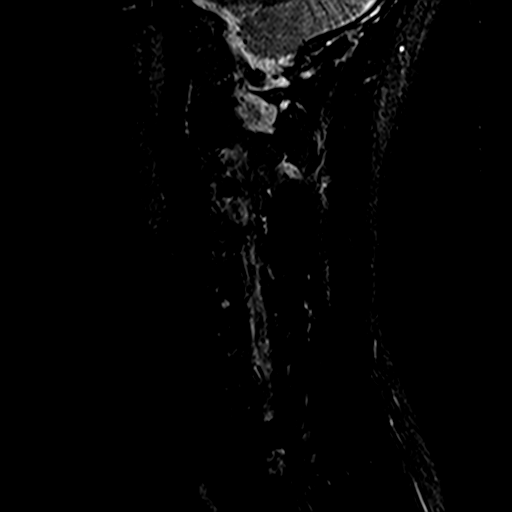
[im 15/15]
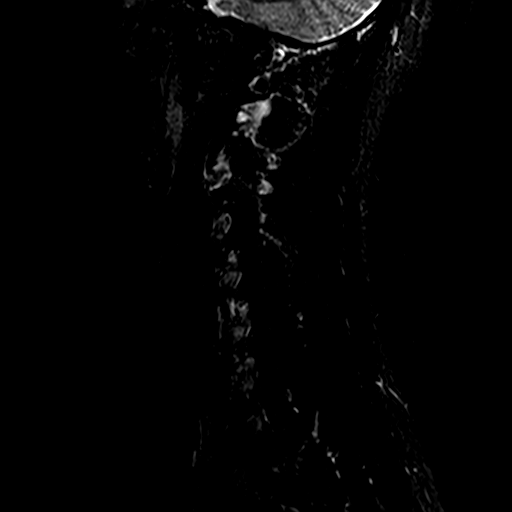

[Series 7: T2 · axial · 3.0mm · 0.70mm/px · z∈[-17,+87]mm · 9 of 28 slices shown (2 of 2)]
[im 1/28]
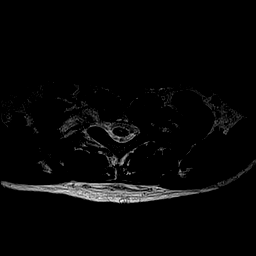
[im 5/28]
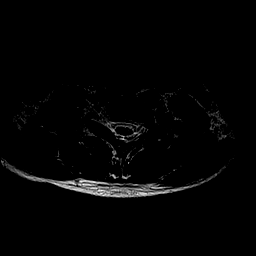
[im 10/28]
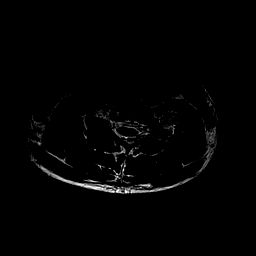
[im 12/28]
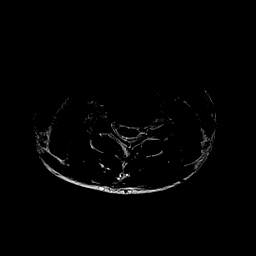
[im 14/28]
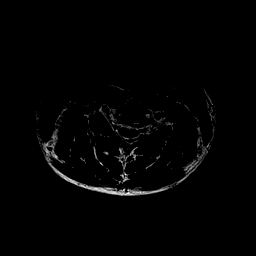
[im 16/28]
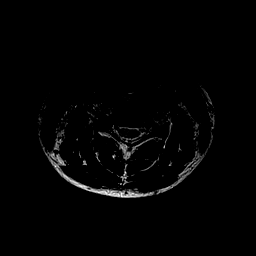
[im 19/28]
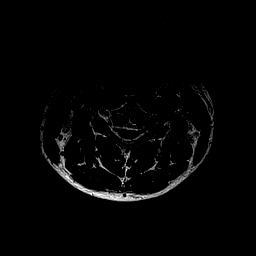
[im 23/28]
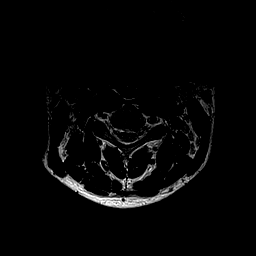
[im 28/28]
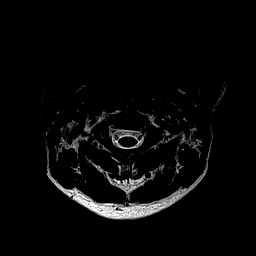

[Series 8: mpgr ax · axial · 3.0mm · 0.35mm/px · z∈[-17,+26]mm · 4 of 28 slices shown]
[im 1/28]
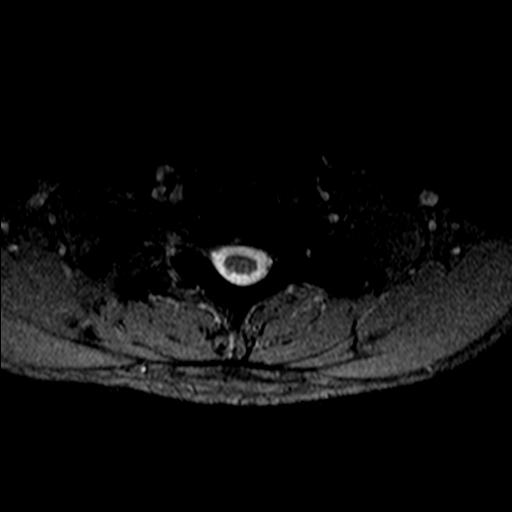
[im 5/28]
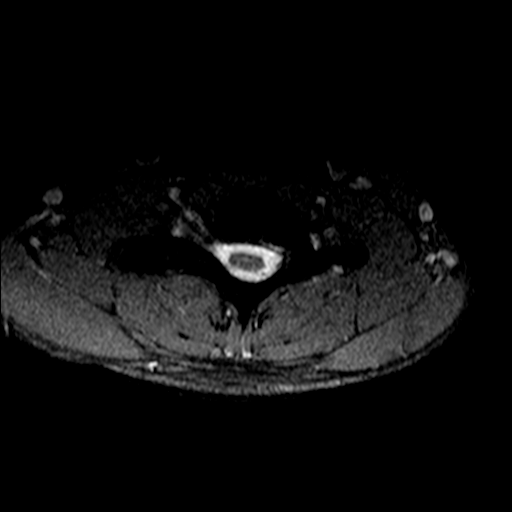
[im 10/28]
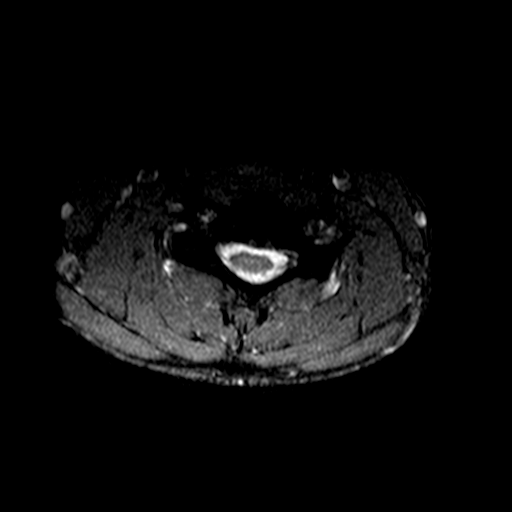
[im 12/28]
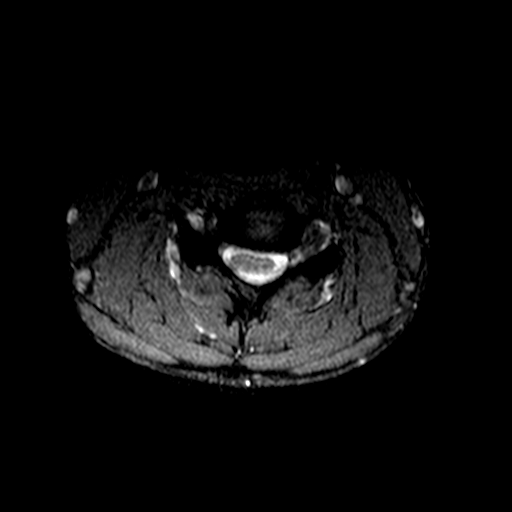

[35 of 48 positions shown; findings below may reference images not displayed]

FINDINGS: Vertebral body height, signal and alignment are maintained. The
craniocervical junction is normal and cervical cord signal is
normal. Imaged paraspinous structures appear normal.

C2-3:  Negative.

C3-4: Minimal disc bulge without central canal or foraminal
narrowing.

C4-5: Minimal disc bulge without central canal or foraminal
narrowing.

C5-6: Minimal disc bulge without central canal or foraminal
narrowing.

C6-7: The disc is desiccated with a very shallow bulge. The central
canal and foramina are widely patent.

C7-T1:  Negative.
IMPRESSION: No finding to explain the patient's symptoms. Mild degenerative disc
disease without central canal or foraminal narrowing is seen as
described above.

## 2017-07-13 ENCOUNTER — Other Ambulatory Visit: Payer: Self-pay | Admitting: Family Medicine

## 2017-09-27 ENCOUNTER — Encounter: Payer: Self-pay | Admitting: Physician Assistant

## 2017-09-27 ENCOUNTER — Ambulatory Visit: Payer: BLUE CROSS/BLUE SHIELD | Admitting: Physician Assistant

## 2017-09-27 VITALS — BP 118/86 | HR 88 | Temp 97.8°F | Resp 16 | Wt 196.0 lb

## 2017-09-27 DIAGNOSIS — K602 Anal fissure, unspecified: Secondary | ICD-10-CM

## 2017-09-27 MED ORDER — HYDROCORTISONE 2.5 % RE CREA: 1.0000 "application " | TOPICAL_CREAM | Freq: Two times a day (BID) | RECTAL | 0 refills | Status: DC

## 2017-09-27 NOTE — Progress Notes (Signed)
       [name] Male    DOB: [birth year]   22 y.o.   MRN: [id number] Visit Date: 09/27/2017  Today's Provider: Margaretann LovelessJennifer M Burnette, PA-C   Chief Complaint  Patient presents with  . Hemorrhoids   Subjective:    HPI Patient here today C/O possible hemorrhoids x's a few weeks. Patient reports burning and pain with bowel movents. Patient reports some blood after wiping. Patient has not tried any OTC treatments.  Patient does report constipation more often than normal BM. Does have some straining with BM. Also reports doing a lot of heavy lifting with work outs and straining during those as well.   He does have family history of colon cancer in his grandfather. Age of onset was 345.     No Known Allergies   Current Outpatient Medications:  .  clobetasol ointment (TEMOVATE) 0.05 %, APPLY 1 APPLICATION TOPICALLY 2 (TWO) TIMES DAILY. TO HANDS (Patient not taking: Reported on 09/27/2017), Disp: 30 g, Rfl: 5  Review of Systems  Constitutional: Negative.   Respiratory: Negative.   Cardiovascular: Negative.   Gastrointestinal: Positive for rectal pain.    Social History   Tobacco Use  . Smoking status: Never Smoker  . Smokeless tobacco: Never Used  Substance Use Topics  . Alcohol use: Yes    Comment: occasionally   Objective:   BP 118/86 (BP Location: Left Arm, Patient Position: Sitting, Cuff Size: Normal)   Pulse 88   Temp 97.8 F (36.6 C) (Oral)   Resp 16   Wt 196 lb (88.9 kg)   SpO2 99%   BMI 24.50 kg/m  Vitals:   09/27/17 0846  BP: 118/86  Pulse: 88  Resp: 16  Temp: 97.8 F (36.6 C)  TempSrc: Oral  SpO2: 99%  Weight: 196 lb (88.9 kg)     Physical Exam  Constitutional: He appears well-developed and well-nourished. No distress.  HENT:  Head: Normocephalic and atraumatic.  Neck: Normal range of motion. Neck supple.  Cardiovascular: Normal rate, regular rhythm and normal heart sounds. Exam reveals no gallop and no friction rub.  No murmur  heard. Pulmonary/Chest: Effort normal and breath sounds normal. No respiratory distress. He has no rales.  Abdominal: Soft. Bowel sounds are normal. He exhibits no distension and no mass. There is no tenderness.  Genitourinary: Testes normal and penis normal. Rectal exam shows fissure. Rectal exam shows no external hemorrhoid.    Circumcised. No discharge found.  Skin: He is not diaphoretic.  Vitals reviewed.       Assessment & Plan:     1. Fissure, anal Will treat with anusol rectal cream as below for fissure. Discussed bowel health. Add probiotic. May add stool softener. If still having constipation may add miralax next. He is to call if symptoms fail to improve.  - hydrocortisone (ANUSOL-HC) 2.5 % rectal cream; Place 1 application rectally 2 (two) times daily.  Dispense: 30 g; Refill: 0       Margaretann LovelessJennifer M Burnette, PA-C  [facility]

## 2017-09-27 NOTE — Patient Instructions (Signed)
Anal Fissure, Adult An anal fissure is a small tear or crack in the skin around the opening of the butt (anus).Bleeding from the tear or crack usually stops on its own within a few minutes. The bleeding may happen every time you poop (have a bowel movement) until the tear or crack heals. Follow these instructions at home: Eating and drinking  Avoid bananas and dairy products. These foods can make it hard to poop.  Drink enough fluid to keep your pee (urine) clear or pale yellow.  Eat a lot of fruit, whole grains, and vegetables. General instructions  Keep the butt area as clean and dry as you can.  Take a warm water bath (sitz bath) as told by your doctor. Do not use soap.  Take over-the-counter and prescription medicines only as told by your doctor.  Use creams or ointments only as told by your doctor.  Keep all follow-up visits as told by your doctor. This is important. Contact a doctor if:  You have more bleeding.  You have a fever.  You have watery poop (diarrhea) that is mixed with blood.  You have pain.  You problem gets worse, not better. This information is not intended to replace advice given to you by your health care provider. Make sure you discuss any questions you have with your health care provider. Document Released: 09/24/2010 Document Revised: 07/04/2015 Document Reviewed: 04/23/2014 Elsevier Interactive Patient Education  2018 Elsevier Inc.   

## 2019-06-27 ENCOUNTER — Other Ambulatory Visit: Payer: Self-pay

## 2019-06-27 ENCOUNTER — Encounter: Payer: Self-pay | Admitting: Family Medicine

## 2019-06-27 ENCOUNTER — Ambulatory Visit: Payer: BC Managed Care – PPO | Admitting: Family Medicine

## 2019-06-27 ENCOUNTER — Ambulatory Visit: Payer: Self-pay | Admitting: *Deleted

## 2019-06-27 VITALS — BP 112/70 | HR 105 | Temp 97.5°F | Resp 16 | Ht 74.0 in | Wt 185.8 lb

## 2019-06-27 DIAGNOSIS — L259 Unspecified contact dermatitis, unspecified cause: Secondary | ICD-10-CM | POA: Diagnosis not present

## 2019-06-27 MED ORDER — PREDNISONE 5 MG PO TABS: 5.0000 mg | ORAL_TABLET | Freq: Every day | ORAL | 0 refills | Status: DC

## 2019-06-27 NOTE — Telephone Encounter (Signed)
Pt called in c/o his left eyelids being swollen almost shut with mild swelling of his upper eyelid on his right eye.   He usually uses Neutragena sunscreen but was out of it and used a different sunscreen yesterday.    He took a Benadryl last night before going to bed.   The right eye is much better but the left eye is still swollen almost shut. He drives for [employer].   He is in [place], [place] now.    I made him an afternoon appt because he wasn't able to make it to an early morning appt. He denies pain, itching or vision changes.   It's just hard to see from my left eye due to my eyelids being so swollen.  COVID-19 screening questionnaire done and ok for in office visit. I made him an appt for today at 2:20 with Dortha Kern, PA because he wasn't able to get in town for a morning appt with his PCP.  He was agreeable to this.  Since he is out of town he is not able to get in within the 4 hour window the protocol calls for.     Care advice given.  I sent my notes to [facility] Reason for Disposition . [1] SEVERE eyelid swelling on one side AND [2] red and painful (or tender to touch)  Answer Assessment - Initial Assessment Questions 1. ONSET: "When did the swelling start?" (e.g., minutes, hours, days)     Both eyes started yesterday morning swelling.   I took Benadryl last night but the left eye remains swollen.   The right eye is better.  I used a different sunscreen yesterday.  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   My vision is fine but I can't hardly open my left eye. 2. LOCATION: "What part of the eyelids is swollen?"     Left eye is upper lid mainly but some on the lower swelling.   Right eye puffy on top lid. 3. SEVERITY: "How swollen is it?"     See above 4. ITCHING: "Is there any itching?" If so, ask: "How much?"   (Scale 1-10; mild, moderate or severe)     No 5. PAIN: "Is the swelling painful to touch?" If so, ask: "How painful is it?"   (Scale 1-10; mild, moderate or severe)     No  pain 6. FEVER: "Do you have a fever?" If so, ask: "What is it, how was it measured, and when did it start?"      Not asked 7. CAUSE: "What do you think is causing the swelling?"     Sunscreen 8. RECURRENT SYMPTOM: "Have you had eyelid swelling before?" If so, ask: "When was the last time?" "What happened that time?"     No  I used something different than the Nutrigena sunscreen. 9. OTHER SYMPTOMS: "Do you have any other symptoms?" (e.g., blurred vision, eye discharge, rash, runny nose)     No drainage. 10. PREGNANCY: "Is there any chance you are pregnant?" "When was your last menstrual period?"       N/A  Protocols used: EYE - [facility]

## 2019-06-27 NOTE — Patient Instructions (Signed)
Contact Dermatitis °Dermatitis is redness, soreness, and swelling (inflammation) of the skin. Contact dermatitis is a reaction to something that touches the skin. °There are two types of contact dermatitis: °· Irritant contact dermatitis. This happens when something bothers (irritates) your skin, like soap. °· Allergic contact dermatitis. This is caused when you are exposed to something that you are allergic to, such as poison ivy. °What are the causes? °· Common causes of irritant contact dermatitis include: °? Makeup. °? Soaps. °? Detergents. °? Bleaches. °? Acids. °? Metals, such as nickel. °· Common causes of allergic contact dermatitis include: °? Plants. °? Chemicals. °? Jewelry. °? Latex. °? Medicines. °? Preservatives in products, such as clothing. °What increases the risk? °· Having a job that exposes you to things that bother your skin. °· Having asthma or eczema. °What are the signs or symptoms? °Symptoms may happen anywhere the irritant has touched your skin. Symptoms include: °· Dry or flaky skin. °· Redness. °· Cracks. °· Itching. °· Pain or a burning feeling. °· Blisters. °· Blood or clear fluid draining from skin cracks. °With allergic contact dermatitis, swelling may occur. This may happen in places such as the eyelids, mouth, or genitals. °How is this treated? °· This condition is treated by checking for the cause of the reaction and protecting your skin. Treatment may also include: °? Steroid creams, ointments, or medicines. °? Antibiotic medicines or other ointments, if you have a skin infection. °? Lotion or medicines to help with itching. °? A bandage (dressing). °Follow these instructions at home: °Skin care °· Moisturize your skin as needed. °· Put cool cloths on your skin. °· Put a baking soda paste on your skin. Stir water into baking soda until it looks like a paste. °· Do not scratch your skin. °· Avoid having things rub up against your skin. °· Avoid the use of soaps, perfumes, and  dyes. °Medicines °· Take or apply over-the-counter and prescription medicines only as told by your doctor. °· If you were prescribed an antibiotic medicine, take or apply it as told by your doctor. Do not stop using it even if your condition starts to get better. °Bathing °· Take a bath with: °? Epsom salts. °? Baking soda. °? Colloidal oatmeal. °· Bathe less often. °· Bathe in warm water. Avoid using hot water. °Bandage care °· If you were given a bandage, change it as told by your health care provider. °· Wash your hands with soap and water before and after you change your bandage. If soap and water are not available, use hand sanitizer. °General instructions °· Avoid the things that caused your reaction. If you do not know what caused it, keep a journal. Write down: °? What you eat. °? What skin products you use. °? What you drink. °? What you wear in the area that has symptoms. This includes jewelry. °· Check the affected areas every day for signs of infection. Check for: °? More redness, swelling, or pain. °? More fluid or blood. °? Warmth. °? Pus or a bad smell. °· Keep all follow-up visits as told by your doctor. This is important. °Contact a doctor if: °· You do not get better with treatment. °· Your condition gets worse. °· You have signs of infection, such as: °? More swelling. °? Tenderness. °? More redness. °? Soreness. °? Warmth. °· You have a fever. °· You have new symptoms. °Get help right away if: °· You have a very bad headache. °· You have neck pain. °·   Your neck is stiff. °· You throw up (vomit). °· You feel very sleepy. °· You see red streaks coming from the area. °· Your bone or joint near the area hurts after the skin has healed. °· The area turns darker. °· You have trouble breathing. °Summary °· Dermatitis is redness, soreness, and swelling of the skin. °· Symptoms may occur where the irritant has touched you. °· Treatment may include medicines and skin care. °· If you do not know what caused  your reaction, keep a journal. °· Contact a doctor if your condition gets worse or you have signs of infection. °This information is not intended to replace advice given to you by your health care provider. Make sure you discuss any questions you have with your health care provider. °Document Revised: 05/18/2018 Document Reviewed: 08/11/2017 °Elsevier Patient Education © 2020 Elsevier Inc. ° °

## 2019-06-27 NOTE — Progress Notes (Addendum)
Established patient visit   [name]   DOB: 22-Sep-1995   24 y.o. Male  MRN: [id number] Visit Date: 06/27/2019  Today's healthcare provider: Dortha Kern, PA   Chief Complaint  Patient presents with  . Facial Swelling   Subjective    HPI Pt c/o his left eyelids being swollen almost shut with mild swelling of his upper eyelid on his right right eye. He usually uses Neutragena sunscreen but was out of it and used a different sunscreen yesterday. He took a Benadryl last night before going to bed. Reports that he also took Benadryl,IBuprofen and put iced on it Monday morning.The right eye is much better but the left eye is still swollen almost shut.He denies pain,no discharge, itching or vision changes. It's just hard to see from my left eye due to my eyelids being so swollen.  Patient Active Problem List   Diagnosis Date Noted  . 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 Paresthesia 06/20/2015  . Neck muscle strain 05/17/2015  . History of kidney stones 09/11/2014  . Eczema 09/11/2014  . Neck swelling 09/11/2014   No past medical history on file. Past Surgical History:  Procedure Laterality Date  . HYDROCELE EXCISION / REPAIR    . TYMPANOSTOMY TUBE PLACEMENT  1996   Social History   Tobacco Use  . Smoking status: Never Smoker  . Smokeless tobacco: Never Used  Substance Use Topics  . Alcohol use: Yes    Comment: occasionally  . Drug use: No   Family Status  Relation Name Status  . Mother  Alive  . Father  Alive  . Brother  Alive  . MGM  Alive  . MGF  Alive  . Brother  Alive   No Known Allergies     Medications: Outpatient Medications Prior to Visit  Medication Sig  . clobetasol ointment (TEMOVATE) 0.05 % APPLY 1 APPLICATION TOPICALLY 2 (TWO) TIMES DAILY. TO HANDS (Patient not taking: Reported on 09/27/2017)  . hydrocortisone (ANUSOL-HC) 2.5 % rectal cream Place 1 application rectally 2 (two) times daily. (Patient not taking: Reported on 06/27/2019)    No facility-administered medications prior to visit.    Review of Systems  Constitutional: Negative.   HENT: Negative.   Eyes: Negative.   Respiratory: Negative.   Skin: Positive for rash.       Pinkness and puffiness left upper and lower eyelids and cheek.      Objective    BP 112/70 (BP Location: Right Arm, Patient Position: Sitting, Cuff Size: Large)   Pulse (!) 105   Temp (!) 97.5 F (36.4 C) (Temporal)   Resp 16   Ht 6\' 2"  (1.88 m)   Wt 185 lb 12.8 oz (84.3 kg)   SpO2 99%   BMI 23.86 kg/m    Physical Exam Constitutional:      General: He is not in acute distress.    Appearance: He is well-developed.  HENT:     Head: Normocephalic and atraumatic.     Right Ear: Hearing normal.     Left Ear: Hearing normal.     Nose: Nose normal.  Eyes:     General: Lids are normal. No scleral icterus.       Right eye: No discharge.        Left eye: No discharge.     Conjunctiva/sclera: Conjunctivae normal.  Pulmonary:     Effort: Pulmonary effort is normal. No respiratory distress.  Musculoskeletal:        General:  Normal range of motion.  Skin:    Findings: Rash present. No lesion.     Comments: Slight swelling and pinkness around the left eye. No drainage from the eye or conjunctival irritation/erythema. Normal vision.  Neurological:     Mental Status: He is alert and oriented to person, place, and time.  Psychiatric:        Speech: Speech normal.        Behavior: Behavior normal.        Thought Content: Thought content normal.      No results found for any visits on 06/27/19.    Assessment & Plan     1. Contact dermatitis, unspecified contact dermatitis type, unspecified trigger Onset over the past weekend after using a new sunscreen. Puffy pink reaction to left eyelids and cheek. No vision changes or conjunctival reaction. No itching. Will treat with prednisone taper and discontinue use of this particular sunscreen. - predniSONE (DELTASONE) 5 MG tablet; Take  1 tablet (5 mg total) by mouth daily with breakfast. Taper down by 1 tablet daily starting at 6 day 1, 5 day 2, 4 day 3, 3 day 4, 2 day 5 and 1 day 6.  Dispense: 21 tablet; Refill: 0   No follow-ups on file.      Andres Shad, PA, have reviewed all documentation for this visit. The documentation on 06/27/19 for the exam, diagnosis, procedures, and orders are all accurate and complete.    Vernie Murders, [place] [phone] (phone) [phone] (fax)  [place]

## 2019-07-17 ENCOUNTER — Other Ambulatory Visit: Payer: Self-pay | Admitting: Family Medicine

## 2019-07-17 NOTE — Telephone Encounter (Signed)
Please advise 

## 2019-07-17 NOTE — Telephone Encounter (Signed)
Copied from CRM [id number]. Topic: Quick Communication - Rx Refill/Question >> Jul 17, 2019  3:50 PM Dalphine Handing A wrote: Medication: predniSONE (DELTASONE) 5 MG tablet (Patient is out of town and requesting medication be sent  to pharmacy as soon as possible.)  Has the patient contacted their pharmacy? {yes (Agent: If no, request that the patient contact the pharmacy for the refill.) (Agent: If yes, when and what did the pharmacy advise?)contact pcp  Preferred Pharmacy (with phone number or street name):[id number] - [place], [place] - [address] AT [address]  Phone:  [phone] Fax:  [phone]     Agent: Please be advised that RX refills may take up to 3 business days. We ask that you follow-up with your pharmacy.

## 2019-07-17 NOTE — Telephone Encounter (Signed)
Patient returned call and is requesting for a prescription for prednisone for bilateral eye swelling. Patient states that he was treated for similar symptoms on 06/27/19. Pt states he feels like it is a reaction to the sun. Patient states during previous visit he felt the swelling may have been due to using sunscreen but he does state he has not used any sunscreen on his face since [place] at the beach. Pt denies any itching or drainage but pt's girlfriend states his eyes are red. Patient states that he does not want his eyes to get like they were before during the previous visit. Pt states that he is currently at the beach. Patient advised that he would need to seek treatment at Urgent Care or ED for current symptoms. Patient verbalized understanding.

## 2019-07-18 ENCOUNTER — Telehealth: Payer: Self-pay

## 2019-07-18 NOTE — Telephone Encounter (Signed)
Copied from CRM [id number]. Topic: General - Other >> Jul 17, 2019  4:53 PM Mcneil, Ja-Kwan wrote: Reason for CRM: Pt stated he was returning the call to the office from a missed call he just had. Pt requests call back.

## 2020-06-05 ENCOUNTER — Ambulatory Visit
Admission: EM | Admit: 2020-06-05 | Discharge: 2020-06-05 | Disposition: A | Discharge status: Home / Self Care | Payer: BC Managed Care – PPO | Attending: Sports Medicine | Admitting: Sports Medicine

## 2020-06-05 ENCOUNTER — Other Ambulatory Visit: Payer: Self-pay

## 2020-06-05 ENCOUNTER — Ambulatory Visit: Payer: Self-pay | Admitting: *Deleted

## 2020-06-05 DIAGNOSIS — H5789 Other specified disorders of eye and adnexa: Secondary | ICD-10-CM | POA: Diagnosis not present

## 2020-06-05 DIAGNOSIS — H5712 Ocular pain, left eye: Secondary | ICD-10-CM

## 2020-06-05 DIAGNOSIS — S0502XA Injury of conjunctiva and corneal abrasion without foreign body, left eye, initial encounter: Secondary | ICD-10-CM | POA: Diagnosis not present

## 2020-06-05 MED ORDER — TOBRAMYCIN 0.3 % OP SOLN: 2.0000 [drp] | OPHTHALMIC | 0 refills | Status: DC

## 2020-06-05 NOTE — Discharge Instructions (Signed)
You have a very small corneal abrasion.  I am going to treat you with an antibiotic for your eye.  Please use it as directed.  I have also provided educational handouts.  Do not use a eye patch.  I also provided you a work note keeping about today but you should be fine going back to work tomorrow.  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  If you do not feel as though you can return to work, please call our office for an updated work note.

## 2020-06-05 NOTE — ED Provider Notes (Signed)
[facility]    CSN: [id number] Arrival date & time: 06/05/20  0858      History   Chief Complaint Chief Complaint  Patient presents with  . Eye Pain    HPI Mike Torres is a 25 y.o. male.   Patient is a pleasant 25 year old male who presents for evaluation of irritation of his left eye.  He said that something flew into his eye yesterday and since then he has had irritation.  Not really eye pain.  He has some redness to his eye.  No discharge.  No sensitivity to light.  No vision changes.  No painful vision, blurry vision, double vision.  He is generally healthy.  Sees [facility] for his ongoing medical care but could not be seen today.  He denies chest pain shortness of breath or any COVID symptoms.  No red flag signs or symptoms elicited on history.  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  He is concerned because he does need his eyes to do his job.     History reviewed. No pertinent past medical history.  Patient Active Problem List   Diagnosis Date Noted  . 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 Paresthesia 06/20/2015  . Neck muscle strain 05/17/2015  . History of kidney stones 09/11/2014  . Eczema 09/11/2014  . Neck swelling 09/11/2014    Past Surgical History:  Procedure Laterality Date  . HYDROCELE EXCISION / REPAIR    . TYMPANOSTOMY TUBE PLACEMENT  1996       Home Medications    Prior to Admission medications   Medication Sig Start Date End Date Taking? Authorizing Provider  tobramycin (TOBREX) 0.3 % ophthalmic solution Place 2 drops into the left eye every 4 (four) hours. 06/05/20  Yes Delton See, MD  clobetasol ointment (TEMOVATE) 0.05 % APPLY 1 APPLICATION TOPICALLY 2 (TWO) TIMES DAILY. TO HANDS Patient not taking: No sig reported 07/15/17   Erasmo Downer, MD  hydrocortisone (ANUSOL-HC) 2.5 % rectal cream Place 1 application rectally 2 (two) times daily. Patient not taking: No sig reported 09/27/17   Margaretann Loveless,  PA-C  predniSONE (DELTASONE) 5 MG tablet Take 1 tablet (5 mg total) by mouth daily with breakfast. Taper down by 1 tablet daily starting at 6 day 1, 5 day 2, 4 day 3, 3 day 4, 2 day 5 and 1 day 6. 06/27/19   Chrismon, Jodell Cipro, PA-C    Family History Family History  Problem Relation Age of Onset  . Healthy Mother   . Hypertension Father   . Healthy Brother   . Diabetes Maternal Grandmother   . Melanoma Maternal Grandmother   . Breast cancer Maternal Grandmother   . Colon cancer Maternal Grandfather   . Healthy Brother     Social History Social History   Tobacco Use  . Smoking status: Never Smoker  . Smokeless tobacco: Never Used  Vaping Use  . Vaping Use: Never used  Substance Use Topics  . Alcohol use: Yes    Comment: occasionally  . Drug use: No     Allergies   Patient has no known allergies.   Review of Systems Review of Systems  Constitutional: Negative.  Negative for activity change, appetite change, chills, diaphoresis, fatigue and fever.  HENT: Negative.  Negative for congestion and ear pain.   Eyes: Positive for redness and itching. Negative for photophobia, pain, discharge and visual disturbance.  Respiratory: Negative.  Negative for cough and shortness of breath.  Cardiovascular: Negative.  Negative for chest pain and palpitations.  Gastrointestinal: Negative.  Negative for abdominal pain.  Genitourinary: Negative.  Negative for dysuria.  Musculoskeletal: Negative.  Negative for back pain, neck pain and neck stiffness.  Skin: Negative.  Negative for color change, pallor, rash and wound.  Neurological: Negative.  Negative for dizziness, tremors, syncope, facial asymmetry, speech difficulty, weakness, light-headedness, numbness and headaches.     Physical Exam Triage Vital Signs ED Triage Vitals  Enc Vitals Group     BP 06/05/20 0913 (!) 132/59     Pulse Rate 06/05/20 0913 64     Resp 06/05/20 0913 16     Temp 06/05/20 0913 97.9 F (36.6 C)     Temp  Source 06/05/20 0913 Oral     SpO2 06/05/20 0913 100 %     Weight 06/05/20 0912 190 lb (86.2 kg)     Height 06/05/20 0912 6\' 3"  (1.905 m)     Head Circumference --      Peak Flow --      Pain Score 06/05/20 0912 3     Pain Loc --      Pain Edu? --      Excl. in GC? --    No data found.  Updated Vital Signs BP (!) 132/59 (BP Location: Right Arm)   Pulse 64   Temp 97.9 F (36.6 C) (Oral)   Resp 16   Ht 6\' 3"  (1.905 m)   Wt 86.2 kg   SpO2 100%   BMI 23.75 kg/m   Visual Acuity Right Eye Distance: 20/15 Left Eye Distance: 20/25 Bilateral Distance: 20/20 (uncorrected)  Right Eye Near:   Left Eye Near:    Bilateral Near:     Physical Exam Vitals and nursing note reviewed.  Constitutional:      General: He is not in acute distress.    Appearance: Normal appearance. He is not ill-appearing, toxic-appearing or diaphoretic.  HENT:     Head: Normocephalic and atraumatic.     Nose: Nose normal.     Mouth/Throat:     Mouth: Mucous membranes are moist.     Pharynx: No oropharyngeal exudate or posterior oropharyngeal erythema.  Eyes:     General: Lids are normal. Vision grossly intact. No visual field deficit or scleral icterus.       Right eye: No foreign body, discharge or hordeolum.        Left eye: No foreign body, discharge or hordeolum.     Extraocular Movements: Extraocular movements intact.     Conjunctiva/sclera:     Left eye: Left conjunctiva is injected. No chemosis, exudate or hemorrhage.    Pupils: Pupils are equal, round, and reactive to light.     Right eye: No corneal abrasion.     Left eye: Corneal abrasion present.      Comments: Fluorescein exam conducted.  Patient has a small corneal abrasion around 2:00.  There is also an abrasion of the conjunctiva near the corneal abrasion.  There is some injection of the cornea without discharge.  No evidence of conjunctivitis.  Cardiovascular:     Rate and Rhythm: Normal rate and regular rhythm.     Pulses: Normal  pulses.     Heart sounds: Normal heart sounds.  Pulmonary:     Effort: Pulmonary effort is normal.     Breath sounds: Normal breath sounds.  Musculoskeletal:     Cervical back: Normal range of motion and neck supple. No rigidity.  Skin:  General: Skin is warm and dry.     Capillary Refill: Capillary refill takes less than 2 seconds.  Neurological:     General: No focal deficit present.     Mental Status: He is alert and oriented to person, place, and time.      UC Treatments / Results  Labs (all labs ordered are listed, but only abnormal results are displayed) Labs Reviewed - No data to display  EKG   Radiology No results found.  Procedures Procedures (including critical care time)  Medications Ordered in UC Medications - No data to display  Initial Impression / Assessment and Plan / UC Course  I have reviewed the triage vital signs and the nursing notes.  Pertinent labs & imaging results that were available during my care of the patient were reviewed by me and considered in my medical decision making (see chart for details).  Clinical impression: Left eye irritation with a small corneal abrasion.  Treatment plan: 1.  The findings and treatment plan were discussed in detail with the patient.  Patient was in agreement. 2.  Recommended a fluorescein examination.  Risks and benefits were explained in detail to the patient.  Patient provided verbal consent.  Area was cleaned and draped in the usual fashion.  I also used tetracaine to numb the eye.  Examination revealed a small corneal abrasion at 2:00.  There was also an abrasion of the conjunctiva.  No involvement of the pupil.  We will treat accordingly. 3.  Prescribed TobraDex as directed. 4.  Educational handouts provided. 5.  Gave him a work note keep him out of work today.  I think he will be fine to go back to work tomorrow.  If he feels he is not able to do so he will call in for an update a work note.  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 6.  Follow-up with primary care provider if symptoms persist. 7.  Gave him the number and contact information for [facility] and he will call them if his symptoms worsen. 8.  Just supportive care and over-the-counter meds as needed. 9.  Follow-up here as needed.  A 25 modifier was used given that there was a separate identified procedure which included the fluorescein examination.    Final Clinical Impressions(s) / UC Diagnoses   Final diagnoses:  Abrasion of left cornea, initial encounter  Left eye pain  Irritation of left eye     Discharge Instructions     You have a very small corneal abrasion.  I am going to treat you with an antibiotic for your eye.  Please use it as directed.  I have also provided educational handouts.  Do not use a eye patch.  I also provided you a work note keeping about today but you should be fine going back to work tomorrow.  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  If you do not feel as though you can return to work, please call our office for an updated work note.    ED Prescriptions    Medication Sig Dispense Auth. Provider   tobramycin (TOBREX) 0.3 % ophthalmic solution Place 2 drops into the left eye every 4 (four) hours. 5 mL Delton See, MD     PDMP not reviewed this encounter.   Delton See, MD 06/05/20 1008

## 2020-06-05 NOTE — ED Triage Notes (Signed)
Patient states that he was outside yesterday and felt like something flew in his eye. States that he has tried to rinse his eye without success. Patient reports that he has continued to have pain in this eye since. Does not wear glasses or contacts.

## 2020-06-05 NOTE — Telephone Encounter (Signed)
Pt called with complaints of left eye pain around 1800 on 06/03/20; he believes he got something in his eye; he flushed flushed his eye with water, used eye drops, and applied a cold compress; his eye is red, and he can't open it all the way without blinking; he denies tearing and drainage; recommendations made per nurse triage protocol; he verbalized understanding; the pt sees Dr Beryle Flock at [facility]; will route to office for notification of encounter.  Reason for Disposition . [1] Foreign body sensation ("feels like something is in there") AND [2] irrigation didn't help  Answer Assessment - Initial Assessment Questions 1. ONSET: "When did the pain start?" (e.g., minutes, hours, days)     06/04/20 2. TIMING: "Does the pain come and go, or has it been constant since it started?" (e.g., constant, intermittent, fleeting)     constant 3. SEVERITY: "How bad is the pain?"   (Scale 1-10; mild, moderate or severe)   - MILD (1-3): doesn't interfere with normal activities    - MODERATE (4-7): interferes with normal activities or awakens from sleep    - SEVERE (8-10): excruciating pain and patient unable to do normal activities     3-4 out of 10 4. LOCATION: "Where does it hurt?"  (e.g., eyelid, eye, cheekbone)    eye 5. CAUSE: "What do you think is causing the pain?"    ? Something in eye 6. VISION: "Do you have blurred vision or changes in your vision?"      no 7. EYE DISCHARGE: "Is there any discharge (pus) from the eye(s)?"  If yes, ask: "What color is it?"      No discharge 8. FEVER: "Do you have a fever?" If Yes, ask: "What is it, how was it measured, and when did it start?"      no 9. OTHER SYMPTOMS: "Do you have any other symptoms?" (e.g., headache, nasal discharge, facial rash)     no 10. PREGNANCY: "Is there any chance you are pregnant?" "When was your last menstrual period?"      n/a  Protocols used: EYE PAIN-A-AH

## 2020-10-25 ENCOUNTER — Encounter: Payer: BC Managed Care – PPO | Admitting: Family Medicine

## 2021-03-03 ENCOUNTER — Encounter: Payer: BC Managed Care – PPO | Admitting: Family Medicine

## 2021-03-03 NOTE — Progress Notes (Deleted)
Complete physical exam   [name]   DOB: [birth year]   26 y.o. Male  MRN: [id number] Visit Date: 03/03/2021  Today's healthcare provider: Gwyneth Sprout, FNP   No chief complaint on file.  Subjective     HPI  Mike Torres is a 26 y.o. male who presents today for a complete physical exam.  He reports consuming a {diet types:17450} diet. {Exercise:19826} He generally feels {well/fairly well/poorly:18703}. He reports sleeping {well/fairly well/poorly:18703}. He {does/does not:200015} have additional problems to discuss today.   No past medical history on file. Past Surgical History:  Procedure Laterality Date   HYDROCELE EXCISION / REPAIR     TYMPANOSTOMY TUBE PLACEMENT  1996   Social History   Socioeconomic History   Marital status: Single    Spouse name: Not on file   Number of children: Not on file   Years of education: H/S   Highest education level: Not on file  Occupational History    Employer: [employer]    Comment: Full - Time  Tobacco Use   Smoking status: Never   Smokeless tobacco: Never  Vaping Use   Vaping Use: Never used  Substance and Sexual Activity   Alcohol use: Yes    Comment: occasionally   Drug use: No   Sexual activity: Not on file  Other Topics Concern   Not on file  Social History Narrative   Not on file   Social Determinants of Health   Financial Resource Strain: Not on file  Food Insecurity: Not on file  Transportation Needs: Not on file  Physical Activity: Not on file  Stress: Not on file  Social Connections: Not on file  Intimate Partner Violence: Not on file   Family Status  Relation Name Status   Mother  Alive   Father  Alive   Brother  Alive   MGM  Alive   MGF  Alive   Brother  Alive   Family History  Problem Relation Age of Onset   Healthy Mother    Hypertension Father    Healthy Brother    Diabetes Maternal Grandmother    Melanoma Maternal Grandmother    Breast cancer Maternal  Grandmother    Colon cancer Maternal Grandfather    Healthy Brother    No Known Allergies  Patient Care Team: [place], Dionne Bucy, MD as PCP - General (Family Medicine)   Medications: Outpatient Medications Prior to Visit  Medication Sig   clobetasol ointment (TEMOVATE) 0.78 % APPLY 1 APPLICATION TOPICALLY 2 (TWO) TIMES DAILY. TO HANDS (Patient not taking: No sig reported)   hydrocortisone (ANUSOL-HC) 2.5 % rectal cream Place 1 application rectally 2 (two) times daily. (Patient not taking: No sig reported)   predniSONE (DELTASONE) 5 MG tablet Take 1 tablet (5 mg total) by mouth daily with breakfast. Taper down by 1 tablet daily starting at 6 day 1, 5 day 2, 4 day 3, 3 day 4, 2 day 5 and 1 day 6.   tobramycin (TOBREX) 0.3 % ophthalmic solution Place 2 drops into the left eye every 4 (four) hours.   No facility-administered medications prior to visit.    Review of Systems  All other systems reviewed and are negative.  {Labs   Heme   Chem   Endocrine   Serology   Results Review (optional):23779}  Objective    There were no vitals taken for this visit. {Show previous vital signs (optional):23777}  Physical Exam  ***  Last depression  screening scores PHQ 2/9 Scores 06/27/2019 03/22/2017 02/25/2017  PHQ - 2 Score 0 0 0   Last fall risk screening Fall Risk  06/27/2019  Falls in the past year? 0  Number falls in past yr: 0  Injury with Fall? 0  Risk for fall due to : No Fall Risks  Follow up Falls evaluation completed   Last Audit-C alcohol use screening Alcohol Use Disorder Test (AUDIT) 09/27/2017  1. How often do you have a drink containing alcohol? 1  2. How many drinks containing alcohol do you have on a typical day when you are drinking? 1  3. How often do you have six or more drinks on one occasion? 0  AUDIT-C Score 2   A score of 3 or more in women, and 4 or more in men indicates increased risk for alcohol abuse, EXCEPT if all of the points are from question 1    No results found for any visits on 03/03/21.  Assessment & Plan    Routine Health Maintenance and Physical Exam  Exercise Activities and Dietary recommendations  Goals   None     Immunization History  Administered Date(s) Administered   DTaP 05/02/1993, 07/09/1993, 08/29/1993, 05/28/1994, 04/25/1998   Hepatitis A 10/01/2006, 07/29/2009   Hepatitis B 12/28/1993, 05/02/1993, 12/05/1993   HiB (PRP-OMP) 05/02/1993, 07/09/1993, 08/29/1993, 05/28/1994   IPV 05/02/1993, 07/09/1993, 08/29/1993, 02/28/1999   Influenza,inj,Quad PF,6+ Mos 12/03/2016   MMR 04/25/1998, 02/26/2004   Meningococcal Conjugate 07/29/2009   Td 10/01/2006, 03/22/2017   Tdap 10/01/2006   Varicella 10/23/1994, 10/01/2006    Health Maintenance  Topic Date Due   HIV Screening  Never done   Hepatitis C Screening  Never done   INFLUENZA VACCINE  09/09/2020   TETANUS/TDAP  03/23/2027   HPV VACCINES  Aged Out    Discussed health benefits of physical activity, and encouraged him to engage in regular exercise appropriate for his age and condition.  ***  No follow-ups on file.     {provider attestation***:1}   Gwyneth Sprout, [place] [phone] (phone) [phone] (fax)  [place]

## 2021-04-17 ENCOUNTER — Ambulatory Visit (INDEPENDENT_AMBULATORY_CARE_PROVIDER_SITE_OTHER): Payer: BC Managed Care – PPO | Admitting: Family Medicine

## 2021-04-17 ENCOUNTER — Encounter: Payer: Self-pay | Admitting: Family Medicine

## 2021-04-17 ENCOUNTER — Other Ambulatory Visit: Payer: Self-pay

## 2021-04-17 VITALS — BP 124/74 | HR 95 | Temp 98.0°F | Wt 200.0 lb

## 2021-04-17 DIAGNOSIS — Z0000 Encounter for general adult medical examination without abnormal findings: Secondary | ICD-10-CM | POA: Insufficient documentation

## 2021-04-17 DIAGNOSIS — Z1159 Encounter for screening for other viral diseases: Secondary | ICD-10-CM | POA: Diagnosis not present

## 2021-04-17 DIAGNOSIS — Z114 Encounter for screening for human immunodeficiency virus [HIV]: Secondary | ICD-10-CM | POA: Insufficient documentation

## 2021-04-17 NOTE — Assessment & Plan Note (Signed)
Low risk screen ?Treatable, and curable. If left untreated Hep C can lead to cirrhosis and liver failure. ?Encourage routine testing; recommend testing if risk factors change. ? ?

## 2021-04-17 NOTE — Assessment & Plan Note (Signed)
UTD on dental ?Denies vision concerns ?Reports regular sun exposure; encourage daily sunscreen use with reapplication as needed and TBSE annually with dermatology ?  ?Things to do to keep yourself healthy  ?- Exercise at least 30-45 minutes a day, 3-4 days a week.  ?- Eat a low-fat diet with lots of fruits and vegetables, up to 7-9 servings per day.  ?- Seatbelts can save your life. Wear them always.  ?- Smoke detectors on every level of your home, check batteries every year.  ?- Eye Doctor - have an eye exam every 1-2 years  ?- Safe sex - if you may be exposed to STDs, use a condom.  ?- Alcohol -  If you drink, do it moderately, less than 2 drinks per day.  ?- Health Care Power of [place]. Choose someone to speak for you if you are not able.  ?- Depression is common in our stressful world.If you're feeling down or losing interest in things you normally enjoy, please come in for a visit.  ?- Violence - If anyone is threatening or hurting you, please call immediately. ? ? ? ?

## 2021-04-17 NOTE — Progress Notes (Signed)
Argentina Ponder [place],acting as a scribe for Gwyneth Sprout, FNP.,have documented all relevant documentation on the behalf of Gwyneth Sprout, FNP,as directed by  Gwyneth Sprout, FNP while in the presence of Gwyneth Sprout, FNP.    Complete physical exam   [name]   DOB: October 12, 1995   26 y.o. Male  MRN: [id number] Visit Date: 04/17/2021  Today's healthcare provider: Gwyneth Sprout, FNP   Introduced to nurse practitioner role and practice setting.  All questions answered.  Discussed provider/patient relationship and expectations.   No chief complaint on file.  Subjective    Mike Torres is a 26 y.o. male who presents today for a complete physical exam.  He reports consuming a general diet.  He generally feels well. He reports sleeping well. He does not have additional problems to discuss today.   HPI  Patient works for [employer], currently as a Geophysicist/field seismologist- reports good use of knees for lifting. Aware of risk for joint wear/tear- reports regular "stretching". Previously, worked in [employer] for 5 years.  Is engaged to HS girlfriend, wedding planned for July. Has upcoming bachelor weekend planned this weekend in [place].  Fiance is a Therapist, sports and is in Occupational psychologist program at [employer]. They recently bought a home in [place].   Some anxiety about living together, despite their 10+ year relationship. Encouraged "open lines of communication" and a moment to "pause" before comment. Encouraged counseling services either in-person or online.  History reviewed. No pertinent past medical history. Past Surgical History:  Procedure Laterality Date   HYDROCELE EXCISION / REPAIR     TYMPANOSTOMY TUBE PLACEMENT  1996   Social History   Socioeconomic History   Marital status: Single    Spouse name: Not on file   Number of children: Not on file   Years of education: H/S   Highest education level: Not on file  Occupational History    Employer: [employer]    Comment: Full - Time  Tobacco Use   Smoking  status: Never   Smokeless tobacco: Never  Vaping Use   Vaping Use: Never used  Substance and Sexual Activity   Alcohol use: Yes    Comment: occasionally   Drug use: No   Sexual activity: Not on file  Other Topics Concern   Not on file  Social History Narrative   Not on file   Social Determinants of Health   Financial Resource Strain: Not on file  Food Insecurity: Not on file  Transportation Needs: Not on file  Physical Activity: Not on file  Stress: Not on file  Social Connections: Not on file  Intimate Partner Violence: Not on file   Family Status  Relation Name Status   Mother  Alive   Father  Alive   Brother  Alive   MGM  Alive   MGF  Alive   Brother  Alive   Family History  Problem Relation Age of Onset   Healthy Mother    Hypertension Father    Healthy Brother    Diabetes Maternal Grandmother    Melanoma Maternal Grandmother    Breast cancer Maternal Grandmother    Colon cancer Maternal Grandfather    Healthy Brother    No Known Allergies  Patient Care Team: Gwyneth Sprout, FNP as PCP - General (Family Medicine)   Medications: Outpatient Medications Prior to Visit  Medication Sig   [DISCONTINUED] clobetasol ointment (TEMOVATE) 8.10 % APPLY 1 APPLICATION TOPICALLY 2 (TWO) TIMES DAILY.  TO HANDS (Patient not taking: No sig reported)   [DISCONTINUED] hydrocortisone (ANUSOL-HC) 2.5 % rectal cream Place 1 application rectally 2 (two) times daily. (Patient not taking: No sig reported)   [DISCONTINUED] predniSONE (DELTASONE) 5 MG tablet Take 1 tablet (5 mg total) by mouth daily with breakfast. Taper down by 1 tablet daily starting at 6 day 1, 5 day 2, 4 day 3, 3 day 4, 2 day 5 and 1 day 6.   [DISCONTINUED] tobramycin (TOBREX) 0.3 % ophthalmic solution Place 2 drops into the left eye every 4 (four) hours.   No facility-administered medications prior to visit.    Review of Systems  Constitutional: Negative.   HENT: Negative.    Eyes: Negative.   Respiratory:  Negative.    Cardiovascular: Negative.   Gastrointestinal: Negative.   Endocrine: Negative.   Genitourinary: Negative.   Musculoskeletal: Negative.   Skin: Negative.   Allergic/Immunologic: Negative.   Neurological: Negative.   Hematological: Negative.   Psychiatric/Behavioral: Negative.       Objective    BP 124/74 (BP Location: Left Arm, Patient Position: Sitting, Cuff Size: Large)    Pulse 95    Temp 98 F (36.7 C) (Oral)    Wt 200 lb (90.7 kg)    SpO2 99%    BMI 25.00 kg/m     Physical Exam Vitals and nursing note reviewed.  Constitutional:      General: He is awake. He is not in acute distress.    Appearance: Normal appearance. He is well-developed, well-groomed and normal weight. He is not ill-appearing, toxic-appearing or diaphoretic.  HENT:     Head: Normocephalic and atraumatic.     Jaw: There is normal jaw occlusion. No trismus, tenderness, swelling or pain on movement.     Salivary Glands: Right salivary gland is not diffusely enlarged or tender. Left salivary gland is not diffusely enlarged or tender.     Right Ear: Hearing, tympanic membrane, ear canal and external ear normal. There is no impacted cerumen.     Left Ear: Hearing, tympanic membrane, ear canal and external ear normal. There is no impacted cerumen.     Nose: Nose normal. No congestion or rhinorrhea.     Right Turbinates: Not enlarged, swollen or pale.     Left Turbinates: Not enlarged, swollen or pale.     Right Sinus: No maxillary sinus tenderness or frontal sinus tenderness.     Left Sinus: No maxillary sinus tenderness or frontal sinus tenderness.     Mouth/Throat:     Lips: Pink.     Mouth: Mucous membranes are moist. No injury, lacerations, oral lesions or angioedema.     Pharynx: Oropharynx is clear. Uvula midline. No pharyngeal swelling, oropharyngeal exudate or posterior oropharyngeal erythema.     Tonsils: No tonsillar exudate or tonsillar abscesses.  Eyes:     General: Lids are normal.  Vision grossly intact. Gaze aligned appropriately.        Right eye: No discharge.        Left eye: No discharge.     Extraocular Movements: Extraocular movements intact.     Conjunctiva/sclera: Conjunctivae normal.     Pupils: Pupils are equal, round, and reactive to light.  Neck:     Thyroid: No thyroid mass, thyromegaly or thyroid tenderness.     Vascular: No carotid bruit.     Trachea: Trachea normal. No tracheal tenderness.  Cardiovascular:     Rate and Rhythm: Normal rate and regular rhythm.  Pulses: Normal pulses.          Carotid pulses are 2+ on the right side and 2+ on the left side.      Radial pulses are 2+ on the right side and 2+ on the left side.       Femoral pulses are 2+ on the right side and 2+ on the left side.      Popliteal pulses are 2+ on the right side and 2+ on the left side.       Dorsalis pedis pulses are 2+ on the right side and 2+ on the left side.       Posterior tibial pulses are 2+ on the right side and 2+ on the left side.     Heart sounds: Normal heart sounds, S1 normal and S2 normal. No murmur heard.   No friction rub. No gallop.  Pulmonary:     Effort: Pulmonary effort is normal. No respiratory distress.     Breath sounds: Normal breath sounds and air entry. No stridor. No wheezing, rhonchi or rales.  Chest:     Chest wall: No tenderness.  Abdominal:     General: Abdomen is flat. Bowel sounds are normal. There is no distension.     Palpations: Abdomen is soft. There is no mass.     Tenderness: There is no abdominal tenderness. There is no guarding or rebound.     Hernia: No hernia is present.  Genitourinary:    Comments: Exam deferred; denies complaints Musculoskeletal:        General: No swelling, tenderness, deformity or signs of injury. Normal range of motion.     Cervical back: Normal range of motion and neck supple. No rigidity or tenderness.     Right lower leg: No edema.     Left lower leg: No edema.  Lymphadenopathy:     Cervical:  No cervical adenopathy.     Right cervical: No superficial, deep or posterior cervical adenopathy.    Left cervical: No superficial, deep or posterior cervical adenopathy.  Skin:    General: Skin is warm and dry.     Capillary Refill: Capillary refill takes less than 2 seconds.     Coloration: Skin is not jaundiced or pale.     Findings: No bruising, erythema, lesion or rash.  Neurological:     General: No focal deficit present.     Mental Status: He is alert and oriented to person, place, and time. Mental status is at baseline.     GCS: GCS eye subscore is 4. GCS verbal subscore is 5. GCS motor subscore is 6.     Sensory: Sensation is intact. No sensory deficit.     Motor: Motor function is intact. No weakness.     Coordination: Coordination is intact.     Gait: Gait is intact.  Psychiatric:        Attention and Perception: Attention and perception normal.        Mood and Affect: Affect normal. Mood is anxious.        Speech: Speech normal.        Behavior: Behavior normal. Behavior is cooperative.        Thought Content: Thought content normal.        Cognition and Memory: Cognition normal.        Judgment: Judgment normal.     Comments: Nervousness; has not been to the MD in years     Last depression screening scores PHQ 2/9 Scores 04/17/2021  06/27/2019 03/22/2017  PHQ - 2 Score 0 0 0  PHQ- 9 Score 0 - -   Last fall risk screening Fall Risk  04/17/2021  Falls in the past year? 0  Number falls in past yr: -  Injury with Fall? -  Risk for fall due to : -  Follow up -   Last Audit-C alcohol use screening Alcohol Use Disorder Test (AUDIT) 04/17/2021  1. How often do you have a drink containing alcohol? 1  2. How many drinks containing alcohol do you have on a typical day when you are drinking? 0  3. How often do you have six or more drinks on one occasion? 0  AUDIT-C Score 1   A score of 3 or more in women, and 4 or more in men indicates increased risk for alcohol abuse, EXCEPT  if all of the points are from question 1   No results found for any visits on 04/17/21.  Assessment & Plan    Routine Health Maintenance and Physical Exam  Exercise Activities and Dietary recommendations  Goals   None     Immunization History  Administered Date(s) Administered   DTaP 05/02/1993, 07/09/1993, 08/29/1993, 05/28/1994, 04/25/1998   Hepatitis A 10/01/2006, 07/29/2009   Hepatitis B 02-04-94, 05/02/1993, 12/05/1993   HiB (PRP-OMP) 05/02/1993, 07/09/1993, 08/29/1993, 05/28/1994   IPV 05/02/1993, 07/09/1993, 08/29/1993, 02/28/1999   Influenza,inj,Quad PF,6+ Mos 12/03/2016   MMR 04/25/1998, 02/26/2004   Meningococcal Conjugate 07/29/2009   Td 10/01/2006, 03/22/2017   Tdap 10/01/2006   Varicella 10/23/1994, 10/01/2006    Health Maintenance  Topic Date Due   Hepatitis C Screening  Never done   TETANUS/TDAP  03/23/2027   HIV Screening  Completed   HPV VACCINES  Aged Out   INFLUENZA VACCINE  Discontinued    Discussed health benefits of physical activity, and encouraged him to engage in regular exercise appropriate for his age and condition.  Problem List Items Addressed This Visit       Other   Annual physical exam - Primary    UTD on dental Denies vision concerns Reports regular sun exposure; encourage daily sunscreen use with reapplication as needed and TBSE annually with dermatology   Things to do to keep yourself healthy  - Exercise at least 30-45 minutes a day, 3-4 days a week.  - Eat a low-fat diet with lots of fruits and vegetables, up to 7-9 servings per day.  - Seatbelts can save your life. Wear them always.  - Smoke detectors on every level of your home, check batteries every year.  - Eye Doctor - have an eye exam every 1-2 years  - Safe sex - if you may be exposed to STDs, use a condom.  - Alcohol -  If you drink, do it moderately, less than 2 drinks per day.  - [place]. Choose someone to speak for you if you are not able.   - Depression is common in our stressful world.If you're feeling down or losing interest in things you normally enjoy, please come in for a visit.  - Violence - If anyone is threatening or hurting you, please call immediately.         Relevant Orders   CBC with Differential/Platelet   Comprehensive metabolic panel   Lipid Panel With LDL/HDL Ratio   TSH + free T4   Encounter for hepatitis C screening test for low risk patient    Low risk screen Treatable, and curable. If left untreated Hep  C can lead to cirrhosis and liver failure. Encourage routine testing; recommend testing if risk factors change.       Relevant Orders   Hepatitis C antibody   Encounter for screening for human immunodeficiency virus (HIV)    Low risk screen Consented; encouraged to "know your status" Recommend repeat screen if risk factors change       Relevant Orders   HIV Antibody (routine testing w rflx)     Return in about 1 year (around 04/18/2022) for annual examination.     Vonna Kotyk, FNP, have reviewed all documentation for this visit. The documentation on 04/17/21 for the exam, diagnosis, procedures, and orders are all accurate and complete.    Gwyneth Sprout, [place] [phone] (phone) [phone] (fax)  [place]

## 2021-04-17 NOTE — Assessment & Plan Note (Signed)
Low risk screen ?Consented; encouraged to "know your status" ?Recommend repeat screen if risk factors change ? ?

## 2021-04-18 LAB — CBC WITH DIFFERENTIAL/PLATELET
Basophils Absolute: 0.1 10*3/uL (ref 0.0–0.2)
Basos: 1 %
EOS (ABSOLUTE): 0.2 10*3/uL (ref 0.0–0.4)
Eos: 3 %
Hematocrit: 45.6 % (ref 37.5–51.0)
Hemoglobin: 15.6 g/dL (ref 13.0–17.7)
Immature Grans (Abs): 0 10*3/uL (ref 0.0–0.1)
Immature Granulocytes: 0 %
Lymphocytes Absolute: 1.8 10*3/uL (ref 0.7–3.1)
Lymphs: 29 %
MCH: 31.2 pg (ref 26.6–33.0)
MCHC: 34.2 g/dL (ref 31.5–35.7)
MCV: 91 fL (ref 79–97)
Monocytes Absolute: 0.5 10*3/uL (ref 0.1–0.9)
Monocytes: 8 %
Neutrophils Absolute: 3.7 10*3/uL (ref 1.4–7.0)
Neutrophils: 59 %
Platelets: 328 10*3/uL (ref 150–450)
RBC: 5 x10E6/uL (ref 4.14–5.80)
RDW: 11.9 % (ref 11.6–15.4)
WBC: 6.3 10*3/uL (ref 3.4–10.8)

## 2021-04-18 LAB — COMPREHENSIVE METABOLIC PANEL
ALT: 17 IU/L (ref 0–44)
AST: 16 IU/L (ref 0–40)
Albumin/Globulin Ratio: 1.6 (ref 1.2–2.2)
Albumin: 4.6 g/dL (ref 4.1–5.2)
Alkaline Phosphatase: 126 IU/L — ABNORMAL HIGH (ref 44–121)
BUN/Creatinine Ratio: 20 (ref 9–20)
BUN: 18 mg/dL (ref 6–20)
Bilirubin Total: 0.4 mg/dL (ref 0.0–1.2)
CO2: 21 mmol/L (ref 20–29)
Calcium: 9.5 mg/dL (ref 8.7–10.2)
Chloride: 103 mmol/L (ref 96–106)
Creatinine, Ser: 0.91 mg/dL (ref 0.76–1.27)
Globulin, Total: 2.8 g/dL (ref 1.5–4.5)
Glucose: 104 mg/dL — ABNORMAL HIGH (ref 70–99)
Potassium: 4.7 mmol/L (ref 3.5–5.2)
Sodium: 138 mmol/L (ref 134–144)
Total Protein: 7.4 g/dL (ref 6.0–8.5)
eGFR: 118 mL/min/{1.73_m2} (ref >59)

## 2021-04-18 LAB — LIPID PANEL WITH LDL/HDL RATIO
Cholesterol, Total: 150 mg/dL (ref 100–199)
HDL: 49 mg/dL (ref >39)
LDL Chol Calc (NIH): 88 mg/dL (ref 0–99)
LDL/HDL Ratio: 1.8 ratio (ref 0.0–3.6)
Triglycerides: 65 mg/dL (ref 0–149)
VLDL Cholesterol Cal: 13 mg/dL (ref 5–40)

## 2021-04-18 LAB — HEPATITIS C ANTIBODY: Hep C Virus Ab: NONREACTIVE

## 2021-04-18 LAB — HIV ANTIBODY (ROUTINE TESTING W REFLEX): HIV Screen 4th Generation wRfx: NONREACTIVE

## 2021-04-18 LAB — TSH+FREE T4
Free T4: 1.42 ng/dL (ref 0.82–1.77)
TSH: 1.07 u[IU]/mL (ref 0.450–4.500)

## 2021-06-18 ENCOUNTER — Other Ambulatory Visit: Payer: Self-pay | Admitting: Family Medicine

## 2021-07-31 ENCOUNTER — Ambulatory Visit: Payer: BC Managed Care – PPO | Admitting: Family Medicine

## 2021-07-31 ENCOUNTER — Encounter: Payer: Self-pay | Admitting: Family Medicine

## 2021-07-31 VITALS — BP 125/76 | HR 63 | Temp 97.7°F | Resp 16 | Ht 75.0 in | Wt 205.0 lb

## 2021-07-31 DIAGNOSIS — S060X0A Concussion without loss of consciousness, initial encounter: Secondary | ICD-10-CM | POA: Diagnosis not present
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
## 2022-04-23 ENCOUNTER — Encounter: Payer: BC Managed Care – PPO | Admitting: Family Medicine

## 2022-07-24 ENCOUNTER — Ambulatory Visit: Payer: BC Managed Care – PPO | Admitting: Family Medicine

## 2022-07-24 VITALS — BP 129/74 | HR 87 | Temp 99.0°F | Ht 75.0 in | Wt 197.0 lb

## 2022-07-24 DIAGNOSIS — M25522 Pain in left elbow: Secondary | ICD-10-CM | POA: Diagnosis not present

## 2022-07-24 MED ORDER — DOXYCYCLINE HYCLATE 100 MG PO TABS: 100.0000 mg | ORAL_TABLET | Freq: Two times a day (BID) | ORAL | 0 refills | Status: DC

## 2022-07-24 MED ORDER — PREDNISONE 50 MG PO TABS: 50.0000 mg | ORAL_TABLET | Freq: Every day | ORAL | 0 refills | Status: DC

## 2022-07-24 MED ORDER — COLCHICINE 0.6 MG PO TABS: 0.6000 mg | ORAL_TABLET | Freq: Two times a day (BID) | ORAL | 0 refills | Status: DC

## 2022-07-24 NOTE — Progress Notes (Signed)
Established patient visit  [name]   DOB: 1995/12/17   27 y.o. Male  MRN: [id number] Visit Date: 07/24/2022  Today's healthcare provider: Jacky Kindle, FNP  Re Introduced to nurse practitioner role and practice setting.  All questions answered.  Discussed provider/patient relationship and expectations.  Chief Complaint  Patient presents with   Injury    Injury --hit the door, redness, swollen, warm to touch--Wednesday. Pt went to [facility]-prescribe naproxen   Subjective    Injury   HPI     Injury    Additional comments: Injury --hit the door, redness, swollen, warm to touch--Wednesday. Pt went to [facility]-prescribe naproxen      Last edited by Shelly Bombard, CMA on 07/24/2022  2:14 PM.      Medications: Outpatient Medications Prior to Visit  Medication Sig   NAPROXEN PO Take by mouth.   ondansetron (ZOFRAN) 4 MG tablet Take 4 mg by mouth every 8 (eight) hours as needed.   promethazine-dextromethorphan (PROMETHAZINE-DM) 6.25-15 MG/5ML syrup Take by mouth.   No facility-administered medications prior to visit.    Review of Systems    Objective    BP 129/74   Pulse 87   Temp 99 F (37.2 C)   Ht 6\' 3"  (1.905 m)   Wt 197 lb (89.4 kg)   SpO2 99%   BMI 24.62 kg/m   Physical Exam Vitals and nursing note reviewed.  Constitutional:      General: He is not in acute distress.    Appearance: Normal appearance. He is normal weight. He is not ill-appearing, toxic-appearing or diaphoretic.  HENT:     Head: Normocephalic and atraumatic.  Cardiovascular:     Rate and Rhythm: Normal rate and regular rhythm.     Pulses: Normal pulses.     Heart sounds: Normal heart sounds.  Pulmonary:     Effort: Pulmonary effort is normal.     Breath sounds: Normal breath sounds.  Musculoskeletal:        General: Swelling present. Normal range of motion.       Arms:     Cervical back: Normal range of motion.  Skin:    General: Skin is warm and dry.     Capillary  Refill: Capillary refill takes less than 2 seconds.     Findings: Erythema present.  Neurological:     General: No focal deficit present.     Mental Status: He is alert and oriented to person, place, and time. Mental status is at baseline.  Psychiatric:        Mood and Affect: Mood normal.        Behavior: Behavior normal.        Thought Content: Thought content normal.        Judgment: Judgment normal.     No results found for any visits on 07/24/22.  Assessment & Plan     Problem List Items Addressed This Visit       Other   Left elbow pain - Primary    Acute, self limiting Worker's comp case 6/12 date of injury  -Start Steroid, 6/15 AM -Start medication to assist with joint effusion, colchicine BID 6/14 pm -Hold ABX pick up for 5-7 days to see site improves given no skin break  Follow up with sports medicine for [place] and possible tap/drain of L elbow bursa  Emergency care needed but not limited to  -shortness of breath -increasing edema in L hand/arm -loss of  pulses in L wrist -change in L hand/arm temperature -change in L hand/arm color- purple/dusky etc      Relevant Orders   Ambulatory referral to Sports Medicine   Return in about 1 week (around 07/31/2022), or if symptoms worsen or fail to improve.     Leilani Merl, FNP, have reviewed all documentation for this visit. The documentation on 07/24/22 for the exam, diagnosis, procedures, and orders are all accurate and complete.  Jacky Kindle, FNP  [facility] [phone] (phone) [phone] (fax)  [facility]

## 2022-07-24 NOTE — Assessment & Plan Note (Signed)
Acute, self limiting Worker's comp case 6/12 date of injury  -Start Steroid, 6/15 AM -Start medication to assist with joint effusion, colchicine BID 6/14 pm -Hold ABX pick up for 5-7 days to see site improves given no skin break  Follow up with sports medicine for [place] and possible tap/drain of L elbow bursa  Emergency care needed but not limited to  -shortness of breath -increasing edema in L hand/arm -loss of pulses in L wrist -change in L hand/arm temperature -change in L hand/arm color- purple/dusky etc

## 2022-07-24 NOTE — Patient Instructions (Signed)
-  Start Steroid, 6/15 AM -Start medication to assist with joint effusion, colchicine BID 6/14 pm -Hold ABX pick up for 5-7 days to see site improves given no skin break  Follow up with sports medicine for [place] and possible tap/drain of L elbow bursa  Emergency care needed but not limited to  -shortness of breath -increasing edema in L hand/arm -loss of pulses in L wrist -change in L hand/arm temperature -change in L hand/arm color- purple/dusky etc

## 2022-08-09 ENCOUNTER — Encounter (HOSPITAL_BASED_OUTPATIENT_CLINIC_OR_DEPARTMENT_OTHER): Payer: Self-pay

## 2022-08-09 ENCOUNTER — Emergency Department (HOSPITAL_BASED_OUTPATIENT_CLINIC_OR_DEPARTMENT_OTHER): Payer: BC Managed Care – PPO

## 2022-08-09 ENCOUNTER — Other Ambulatory Visit: Payer: Self-pay

## 2022-08-09 DIAGNOSIS — S59902A Unspecified injury of left elbow, initial encounter: Secondary | ICD-10-CM | POA: Insufficient documentation

## 2022-08-09 DIAGNOSIS — W228XXA Striking against or struck by other objects, initial encounter: Secondary | ICD-10-CM | POA: Insufficient documentation

## 2022-08-09 DIAGNOSIS — Z5321 Procedure and treatment not carried out due to patient leaving prior to being seen by health care provider: Secondary | ICD-10-CM | POA: Diagnosis not present

## 2022-08-09 NOTE — ED Triage Notes (Signed)
Pt reports left elbow injury after hitting it with a [employer] truck door 3 weeks ago. He has been seen for this, had xrays and was told no fx or dislocation. He is here tonight because it is swollen and pain is worse. He reports he is unable to extend his arm out all the way. + radial pulse

## 2022-08-09 NOTE — Progress Notes (Signed)
Pt does not want an xray. He just had one last Monday and it was negative.

## 2022-08-10 ENCOUNTER — Emergency Department (HOSPITAL_BASED_OUTPATIENT_CLINIC_OR_DEPARTMENT_OTHER)
Admission: EM | Admit: 2022-08-10 | Discharge: 2022-08-10 | Discharge status: Against Medical Advice | Payer: BC Managed Care – PPO | Attending: Emergency Medicine | Admitting: Emergency Medicine

## 2022-12-28 ENCOUNTER — Encounter: Payer: Self-pay | Admitting: Family Medicine

## 2022-12-28 ENCOUNTER — Ambulatory Visit (INDEPENDENT_AMBULATORY_CARE_PROVIDER_SITE_OTHER): Payer: BC Managed Care – PPO | Admitting: Family Medicine

## 2022-12-28 VITALS — BP 129/85 | HR 65 | Ht 75.0 in | Wt 204.7 lb

## 2022-12-28 DIAGNOSIS — R739 Hyperglycemia, unspecified: Secondary | ICD-10-CM

## 2022-12-28 DIAGNOSIS — Z0000 Encounter for general adult medical examination without abnormal findings: Secondary | ICD-10-CM | POA: Diagnosis not present

## 2022-12-28 NOTE — Progress Notes (Signed)
Complete physical exam  [name]   DOB: 1995-09-16   27 y.o. Male  MRN: [id number]  Subjective:    Chief Complaint  Patient presents with   Annual Exam    Last completed 05/07/21. Patient reports consuming a healthy diet. He participates in exercise at least 3 times a week walking on the treadmill but has been out due to elbow injury for the last 5 months. Reports going back to work last week and feeling and sleeping well with no concerns to report.     Mike Torres is a 27 y.o. male who presents today for a complete physical exam.   Discussed the use of AI scribe software for clinical note transcription with the patient, who gave verbal consent to proceed.  History of Present Illness   The patient, a [employer] worker in his late twenties, presents for an annual physical. He reports a recent work-related elbow injury involving a partially torn tendon and a hairline fracture. The injury did not require surgery but necessitated a lengthy period of rehabilitation. The patient feels he has regained function and motion in the elbow, but it is not yet back to 100%. He is unsure how long full recovery will take.   Last year's tests showed slightly elevated blood sugar and one liver function test. The patient's cholesterol was normal. The patient's grandfather had colon cancer, but the patient is unsure of the age at diagnosis. He will find out and inform the doctor.        Most recent fall risk assessment:    07/24/2022    2:20 PM  Fall Risk   Falls in the past year? 0  Number falls in past yr: 0  Injury with Fall? 0     Most recent depression screenings:    07/24/2022    2:20 PM 07/31/2021    4:14 PM  PHQ 2/9 Scores  PHQ - 2 Score 0 0  PHQ- 9 Score 0 0        Patient Care Team: Erasmo Downer, MD as PCP - General (Family Medicine)   Outpatient Medications Prior to Visit  Medication Sig   NAPROXEN PO Take by mouth.   [DISCONTINUED] colchicine 0.6 MG tablet  Take 1 tablet (0.6 mg total) by mouth 2 (two) times daily.   [DISCONTINUED] doxycycline (VIBRA-TABS) 100 MG tablet Take 1 tablet (100 mg total) by mouth 2 (two) times daily.   [DISCONTINUED] ondansetron (ZOFRAN) 4 MG tablet Take 4 mg by mouth every 8 (eight) hours as needed.   [DISCONTINUED] predniSONE (DELTASONE) 50 MG tablet Take 1 tablet (50 mg total) by mouth daily with breakfast.   [DISCONTINUED] promethazine-dextromethorphan (PROMETHAZINE-DM) 6.25-15 MG/5ML syrup Take by mouth.   No facility-administered medications prior to visit.    ROS        Objective:     BP 129/85 (BP Location: Right Arm, Patient Position: Sitting, Cuff Size: Normal)   Pulse 65   Ht 6\' 3"  (1.905 m)   Wt 204 lb 11.2 oz (92.9 kg)   BMI 25.59 kg/m    Physical Exam Vitals reviewed.  Constitutional:      General: He is not in acute distress.    Appearance: Normal appearance. He is well-developed. He is not diaphoretic.  HENT:     Head: Normocephalic and atraumatic.     Right Ear: Tympanic membrane, ear canal and external ear normal.     Left Ear: Tympanic membrane, ear canal and external ear normal.  Nose: Nose normal.     Mouth/Throat:     Mouth: Mucous membranes are moist.     Pharynx: Oropharynx is clear. No oropharyngeal exudate.  Eyes:     General: No scleral icterus.    Conjunctiva/sclera: Conjunctivae normal.     Pupils: Pupils are equal, round, and reactive to light.  Neck:     Thyroid: No thyromegaly.  Cardiovascular:     Rate and Rhythm: Normal rate and regular rhythm.     Pulses: Normal pulses.     Heart sounds: Normal heart sounds. No murmur heard. Pulmonary:     Effort: Pulmonary effort is normal. No respiratory distress.     Breath sounds: Normal breath sounds. No wheezing or rales.  Abdominal:     General: There is no distension.     Palpations: Abdomen is soft.     Tenderness: There is no abdominal tenderness.  Musculoskeletal:        General: No deformity.      Cervical back: Neck supple.     Right lower leg: No edema.     Left lower leg: No edema.  Lymphadenopathy:     Cervical: No cervical adenopathy.  Skin:    General: Skin is warm and dry.     Findings: No rash.  Neurological:     Mental Status: He is alert and oriented to person, place, and time. Mental status is at baseline.     Gait: Gait normal.  Psychiatric:        Mood and Affect: Mood normal.        Behavior: Behavior normal.        Thought Content: Thought content normal.      No results found for any visits on 12/28/22.     Assessment & Plan:    Routine Health Maintenance and Physical Exam  Immunization History  Administered Date(s) Administered   DTaP 05/02/1993, 07/09/1993, 08/29/1993, 05/28/1994, 04/25/1998   HIB (PRP-OMP) 05/02/1993, 07/09/1993, 08/29/1993, 05/28/1994   Hepatitis A 10/01/2006, 07/29/2009   Hepatitis B 1993/04/26, 05/02/1993, 12/05/1993   IPV 05/02/1993, 07/09/1993, 08/29/1993, 02/28/1999   Influenza,inj,Quad PF,6+ Mos 12/03/2016   MMR 04/25/1998, 02/26/2004   Meningococcal Conjugate 07/29/2009   Td 10/01/2006, 03/22/2017   Tdap 10/01/2006   Varicella 10/23/1994, 10/01/2006    Health Maintenance  Topic Date Due   COVID-19 Vaccine (1 - 2023-24 season) Never done   INFLUENZA VACCINE  05/10/2023 (Originally 09/10/2022)   DTaP/Tdap/Td (9 - Td or Tdap) 03/23/2027   Hepatitis C Screening  Completed   HIV Screening  Completed   HPV VACCINES  Aged Out    Discussed health benefits of physical activity, and encouraged him to engage in regular exercise appropriate for his age and condition.  Problem List Items Addressed This Visit       Other   Annual physical exam - Primary   Relevant Orders   CBC with Differential/Platelet   Comprehensive metabolic panel   Lipid Panel With LDL/HDL Ratio   Hemoglobin A1c   Other Visit Diagnoses     Hyperglycemia       Relevant Orders   Hemoglobin A1c           Elbow Injury (Partially Torn Tendon  and Hairline Fracture) Sustained a partially torn tendon and hairline fracture in the elbow approximately five months ago. Undergoing rehabilitation with reported improvement in function and motion, though not yet at 100%. Full recovery can take up to a year. - Continue rehabilitation exercises  Elevated Blood Sugar  Previous labs indicated slightly elevated blood sugar levels. Plan to recheck blood sugar levels and include an A1c test to assess the need for further intervention. - Order A1c test - Order blood sugar test  General Health Maintenance Routine physical examination. Previous labs showed normal cholesterol levels and slightly elevated liver function tests. Up to date on tetanus shot until 2029. Declined flu shot and COVID-19 vaccine. Discussed the importance of regular screenings and vaccinations. First colon cancer screening for men is at 18 or 10 years before a relative's diagnosis if earlier. - Order cholesterol test - Order blood counts - Order kidney and liver function tests - Schedule next year's physical - Discuss the importance of colon cancer screening starting at age 44 or earlier depending on family history  Follow-up - Message with lab results - Determine the age of the grandfather at the time of colon cancer diagnosis to adjust the screening schedule if necessary.       Return in about 1 year (around 12/28/2023) for CPE.     Shirlee Latch, MD

## 2022-12-29 LAB — CBC WITH DIFFERENTIAL/PLATELET
Basophils Absolute: 0.1 10*3/uL (ref 0.0–0.2)
Basos: 1 %
EOS (ABSOLUTE): 0.2 10*3/uL (ref 0.0–0.4)
Eos: 4 %
Hematocrit: 44.5 % (ref 37.5–51.0)
Hemoglobin: 14.8 g/dL (ref 13.0–17.7)
Immature Grans (Abs): 0 10*3/uL (ref 0.0–0.1)
Immature Granulocytes: 0 %
Lymphocytes Absolute: 1.7 10*3/uL (ref 0.7–3.1)
Lymphs: 32 %
MCH: 31.3 pg (ref 26.6–33.0)
MCHC: 33.3 g/dL (ref 31.5–35.7)
MCV: 94 fL (ref 79–97)
Monocytes Absolute: 0.5 10*3/uL (ref 0.1–0.9)
Monocytes: 9 %
Neutrophils Absolute: 2.9 10*3/uL (ref 1.4–7.0)
Neutrophils: 54 %
Platelets: 313 10*3/uL (ref 150–450)
RBC: 4.73 x10E6/uL (ref 4.14–5.80)
RDW: 12.4 % (ref 11.6–15.4)
WBC: 5.4 10*3/uL (ref 3.4–10.8)

## 2022-12-29 LAB — COMPREHENSIVE METABOLIC PANEL
ALT: 39 [IU]/L (ref 0–44)
AST: 27 [IU]/L (ref 0–40)
Albumin: 4.5 g/dL (ref 4.3–5.2)
Alkaline Phosphatase: 116 [IU]/L (ref 44–121)
BUN/Creatinine Ratio: 11 (ref 9–20)
BUN: 10 mg/dL (ref 6–20)
Bilirubin Total: 1.1 mg/dL (ref 0.0–1.2)
CO2: 23 mmol/L (ref 20–29)
Calcium: 9.4 mg/dL (ref 8.7–10.2)
Chloride: 102 mmol/L (ref 96–106)
Creatinine, Ser: 0.95 mg/dL (ref 0.76–1.27)
Globulin, Total: 2.4 g/dL (ref 1.5–4.5)
Glucose: 94 mg/dL (ref 70–99)
Potassium: 4.7 mmol/L (ref 3.5–5.2)
Sodium: 139 mmol/L (ref 134–144)
Total Protein: 6.9 g/dL (ref 6.0–8.5)
eGFR: 111 mL/min/{1.73_m2} (ref >59)

## 2022-12-29 LAB — HEMOGLOBIN A1C
Est. average glucose Bld gHb Est-mCnc: 97 mg/dL
Hgb A1c MFr Bld: 5 % (ref 4.8–5.6)

## 2022-12-29 LAB — LIPID PANEL WITH LDL/HDL RATIO
Cholesterol, Total: 171 mg/dL (ref 100–199)
HDL: 49 mg/dL (ref >39)
LDL Chol Calc (NIH): 96 mg/dL (ref 0–99)
LDL/HDL Ratio: 2 ratio (ref 0.0–3.6)
Triglycerides: 146 mg/dL (ref 0–149)
VLDL Cholesterol Cal: 26 mg/dL (ref 5–40)

## 2023-01-14 ENCOUNTER — Other Ambulatory Visit: Payer: Self-pay

## 2023-01-14 ENCOUNTER — Emergency Department
Admission: EM | Admit: 2023-01-14 | Discharge: 2023-01-14 | Disposition: A | Discharge status: Home / Self Care | Payer: BC Managed Care – PPO | Attending: Emergency Medicine | Admitting: Emergency Medicine

## 2023-01-14 ENCOUNTER — Encounter: Payer: Self-pay | Admitting: Emergency Medicine

## 2023-01-14 DIAGNOSIS — W228XXA Striking against or struck by other objects, initial encounter: Secondary | ICD-10-CM | POA: Diagnosis not present

## 2023-01-14 DIAGNOSIS — S0181XA Laceration without foreign body of other part of head, initial encounter: Secondary | ICD-10-CM | POA: Insufficient documentation

## 2023-01-14 DIAGNOSIS — S0993XA Unspecified injury of face, initial encounter: Secondary | ICD-10-CM | POA: Diagnosis present

## 2023-01-14 NOTE — ED Triage Notes (Signed)
Patient ambulatory to triage with complaints of hitting his head on door frame that has caused small lac above right eyebrow. Bleeding controlled, denies LOC. Incident occurred approx 7pm tonight.

## 2023-01-14 NOTE — Discharge Instructions (Addendum)
The skin glue will begin to fall off on its own in about a week.  Try not to pick at it.  It is waterproof so it is okay for you to shower however do not soak it in water.  You can cover with a bandage while at work if you would like, please make sure that the bandage is clean and dry.  If it becomes wet you will need to change the bandage.  Do not put any topical lotions or creams over the area until the glue has come off.  You can follow-up with plastics if you would like, I recommend waiting for the wound to fully heal before scheduling appointment with them.

## 2023-01-14 NOTE — ED Provider Notes (Signed)
[facility] Provider Note    None    (approximate)   History   Laceration   HPI  Mike Torres is a 27 y.o. male with no PMH who presents for evaluation of laceration to the forehead.  Patient states he hit his head on a door frame and sustained a small laceration.  Bleeding is controlled.  No LOC.  Patient is up-to-date on his tetanus shot.       Physical Exam   Triage Vital Signs: ED Triage Vitals  Encounter Vitals Group     BP 01/14/23 2044 132/79     Systolic BP Percentile --      Diastolic BP Percentile --      Pulse Rate 01/14/23 2044 75     Resp 01/14/23 2044 18     Temp 01/14/23 2044 98.4 F (36.9 C)     Temp Source 01/14/23 2044 Oral     SpO2 01/14/23 2044 96 %     Weight 01/14/23 2045 205 lb 0.4 oz (93 kg)     Height 01/14/23 2045 6\' 3"  (1.905 m)     Head Circumference --      Peak Flow --      Pain Score 01/14/23 2045 6     Pain Loc --      Pain Education --      Exclude from Growth Chart --     Most recent vital signs: Vitals:   01/14/23 2044  BP: 132/79  Pulse: 75  Resp: 18  Temp: 98.4 F (36.9 C)  SpO2: 96%   General: Awake, no distress.  CV:  Good peripheral perfusion.  Resp:  Normal effort.  Abd:  No distention.  Other:  Approximately 2 cm shallow laceration to the right side of the forehead, bleeding is controlled.   ED Results / Procedures / Treatments   Labs (all labs ordered are listed, but only abnormal results are displayed) Labs Reviewed - No data to display   PROCEDURES:  Critical Care performed: No  .Laceration Repair  Date/Time: 01/14/2023 10:47 PM  Performed by: Cameron Ali, PA-C Authorized by: Cameron Ali, PA-C   Consent:    Consent obtained:  Verbal   Consent given by:  Patient   Risks, benefits, and alternatives were discussed: yes     Risks discussed:  Infection, pain and poor cosmetic result   Alternatives discussed:  No treatment Universal protocol:    Patient  identity confirmed:  Verbally with patient Anesthesia:    Anesthesia method:  None Laceration details:    Location:  Face   Face location:  Forehead   Length (cm):  2   Depth (mm):  2 Exploration:    Hemostasis achieved with:  Direct pressure   Wound exploration: entire depth of wound visualized   Treatment:    Area cleansed with:  Saline   Amount of cleaning:  Standard   Irrigation solution:  Sterile saline Skin repair:    Repair method:  Tissue adhesive Approximation:    Approximation:  Close Repair type:    Repair type:  Simple Post-procedure details:    Dressing:  Open (no dressing)   Procedure completion:  Tolerated well, no immediate complications    MEDICATIONS ORDERED IN ED: Medications - No data to display   IMPRESSION / MDM / ASSESSMENT AND PLAN / ED COURSE  I reviewed the triage vital signs and the nursing notes.  27 year old male presents for evaluation of forehead laceration.  Vital signs are stable, NAD on exam.  Differential diagnosis includes, but is not limited to, laceration repair.  Patient's presentation is most consistent with acute, uncomplicated illness.  Laceration repaired as described in the procedure note above.  Patient was instructed on wound care.  He voiced understanding, all questions were answered and he was stable at discharge.     FINAL CLINICAL IMPRESSION(S) / ED DIAGNOSES   Final diagnoses:  Facial laceration, initial encounter     Rx / DC Orders   ED Discharge Orders     None        Note:  This document was prepared using Dragon voice recognition software and may include unintentional dictation errors.   Cameron Ali, PA-C 01/14/23 2251    Minna Antis, MD 01/18/23 2237

## 2023-12-30 ENCOUNTER — Encounter: Admitting: Family Medicine

## 2023-12-31 ENCOUNTER — Encounter: Payer: Self-pay | Admitting: Family Medicine

## 2024-02-21 ENCOUNTER — Encounter: Admitting: Family Medicine

## 2024-03-27 ENCOUNTER — Encounter: Admitting: Family Medicine
